# Patient Record
Sex: Female | Born: 1940 | Smoking: Former smoker
Health system: Southern US, Community
[De-identification: ages and names within clinical notes are randomized; demographics above are authoritative.]

## PROBLEM LIST (undated history)

## (undated) DIAGNOSIS — IMO0001 Reserved for inherently not codable concepts without codable children: Secondary | ICD-10-CM

## (undated) DIAGNOSIS — R011 Cardiac murmur, unspecified: Secondary | ICD-10-CM

## (undated) DIAGNOSIS — T8859XA Other complications of anesthesia, initial encounter: Secondary | ICD-10-CM

## (undated) DIAGNOSIS — K766 Portal hypertension: Secondary | ICD-10-CM

## (undated) DIAGNOSIS — I2721 Secondary pulmonary arterial hypertension: Secondary | ICD-10-CM

## (undated) DIAGNOSIS — I4891 Unspecified atrial fibrillation: Secondary | ICD-10-CM

## (undated) DIAGNOSIS — M353 Polymyalgia rheumatica: Secondary | ICD-10-CM

## (undated) DIAGNOSIS — C801 Malignant (primary) neoplasm, unspecified: Secondary | ICD-10-CM

## (undated) DIAGNOSIS — K219 Gastro-esophageal reflux disease without esophagitis: Secondary | ICD-10-CM

## (undated) DIAGNOSIS — D649 Anemia, unspecified: Secondary | ICD-10-CM

## (undated) DIAGNOSIS — T4145XA Adverse effect of unspecified anesthetic, initial encounter: Secondary | ICD-10-CM

## (undated) DIAGNOSIS — G629 Polyneuropathy, unspecified: Secondary | ICD-10-CM

## (undated) DIAGNOSIS — G709 Myoneural disorder, unspecified: Secondary | ICD-10-CM

## (undated) DIAGNOSIS — J45909 Unspecified asthma, uncomplicated: Secondary | ICD-10-CM

## (undated) DIAGNOSIS — J189 Pneumonia, unspecified organism: Secondary | ICD-10-CM

## (undated) DIAGNOSIS — I959 Hypotension, unspecified: Secondary | ICD-10-CM

## (undated) DIAGNOSIS — I499 Cardiac arrhythmia, unspecified: Secondary | ICD-10-CM

## (undated) DIAGNOSIS — F329 Major depressive disorder, single episode, unspecified: Secondary | ICD-10-CM

## (undated) DIAGNOSIS — R112 Nausea with vomiting, unspecified: Secondary | ICD-10-CM

## (undated) DIAGNOSIS — G4730 Sleep apnea, unspecified: Secondary | ICD-10-CM

## (undated) DIAGNOSIS — Z9889 Other specified postprocedural states: Secondary | ICD-10-CM

## (undated) DIAGNOSIS — E039 Hypothyroidism, unspecified: Secondary | ICD-10-CM

## (undated) DIAGNOSIS — F32A Depression, unspecified: Secondary | ICD-10-CM

## (undated) DIAGNOSIS — J449 Chronic obstructive pulmonary disease, unspecified: Secondary | ICD-10-CM

## (undated) DIAGNOSIS — M1990 Unspecified osteoarthritis, unspecified site: Secondary | ICD-10-CM

## (undated) HISTORY — DX: Portal hypertension: K76.6

## (undated) HISTORY — DX: Unspecified atrial fibrillation: I48.91

## (undated) HISTORY — PX: COLONOSCOPY: SHX174

## (undated) HISTORY — PX: TONSILLECTOMY: SUR1361

## (undated) HISTORY — DX: Secondary pulmonary arterial hypertension: I27.21

## (undated) HISTORY — PX: EYE SURGERY: SHX253

## (undated) HISTORY — PX: JOINT REPLACEMENT: SHX530

## (undated) HISTORY — PX: BACK SURGERY: SHX140

## (undated) HISTORY — PX: OTHER SURGICAL HISTORY: SHX169

## (undated) HISTORY — PX: MASTECTOMY: SHX3

## (undated) HISTORY — PX: BREAST SURGERY: SHX581

## (undated) HISTORY — DX: Polymyalgia rheumatica: M35.3

---

## 2015-08-29 ENCOUNTER — Other Ambulatory Visit: Payer: Self-pay | Admitting: Anesthesiology

## 2015-08-29 DIAGNOSIS — M5416 Radiculopathy, lumbar region: Secondary | ICD-10-CM

## 2015-09-11 ENCOUNTER — Ambulatory Visit
Admission: RE | Admit: 2015-09-11 | Discharge: 2015-09-11 | Disposition: A | Discharge status: Home / Self Care | Payer: Medicare Other | Source: Ambulatory Visit | Attending: Anesthesiology | Admitting: Anesthesiology

## 2015-09-11 ENCOUNTER — Other Ambulatory Visit: Payer: Self-pay

## 2015-09-11 DIAGNOSIS — M5416 Radiculopathy, lumbar region: Secondary | ICD-10-CM

## 2015-09-11 NOTE — Discharge Instructions (Signed)
Myelogram Discharge Instructions  1. Go home and rest quietly for the next 24 hours.  It is important to lie flat for the next 24 hours.  Get up only to go to the restroom.  You may lie in the bed or on a couch on your back, your stomach, your left side or your right side.  You may have one pillow under your head.  You may have pillows between your knees while you are on your side or under your knees while you are on your back.  2. DO NOT drive today.  Recline the seat as far back as it will go, while still wearing your seat belt, on the way home.  3. You may get up to go to the bathroom as needed.  You may sit up for 10 minutes to eat.  You may resume your normal diet and medications unless otherwise indicated.  Drink lots of extra fluids today and tomorrow.  4. The incidence of headache, nausea, or vomiting is about 5% (one in 20 patients).  If you develop a headache, lie flat and drink plenty of fluids until the headache goes away.  Caffeinated beverages may be helpful.  If you develop severe nausea and vomiting or a headache that does not go away with flat bed rest, call [phone].  5. You may resume normal activities after your 24 hours of bed rest is over; however, do not exert yourself strongly or do any heavy lifting tomorrow. If when you get up you have a headache when standing, go back to bed and force fluids for another 24 hours.  6. Call your physician for a follow-up appointment.  The results of your myelogram will be sent directly to your physician by the following day.  7. If you have any questions or if complications develop after you arrive home, please call [phone].  Discharge instructions have been explained to the patient.  The patient, or the person responsible for the patient, fully understands these instructions.       May resume Fluoxetine on Nov. 15, 2016, after 9:30 am.

## 2015-09-13 ENCOUNTER — Other Ambulatory Visit: Payer: Self-pay | Admitting: Anesthesiology

## 2015-09-13 DIAGNOSIS — M5416 Radiculopathy, lumbar region: Secondary | ICD-10-CM

## 2015-09-15 ENCOUNTER — Ambulatory Visit
Admission: RE | Admit: 2015-09-15 | Discharge: 2015-09-15 | Disposition: A | Discharge status: Home / Self Care | Payer: Medicare Other | Source: Ambulatory Visit | Attending: Anesthesiology | Admitting: Anesthesiology

## 2015-09-15 DIAGNOSIS — M5416 Radiculopathy, lumbar region: Secondary | ICD-10-CM

## 2015-09-15 MED ORDER — DIAZEPAM 5 MG PO TABS
5.0000 mg | ORAL_TABLET | Freq: Once | ORAL | Status: AC
  Administered 2015-09-15: 5 mg via ORAL

## 2015-09-15 MED ORDER — IOHEXOL 300 MG/ML  SOLN
10.0000 mL | Freq: Once | INTRAMUSCULAR | Status: DC | PRN
  Administered 2015-09-15: 10 mL via INTRATHECAL

## 2015-09-15 NOTE — Progress Notes (Signed)
Pt states she has been off Fluoxetine and Elavil for the past 2 days.  Discharge instructions explained to pt.

## 2015-09-15 NOTE — Discharge Instructions (Signed)
Myelogram Discharge Instructions  1. Go home and rest quietly for the next 24 hours.  It is important to lie flat for the next 24 hours.  Get up only to go to the restroom.  You may lie in the bed or on a couch on your back, your stomach, your left side or your right side.  You may have one pillow under your head.  You may have pillows between your knees while you are on your side or under your knees while you are on your back.  2. DO NOT drive today.  Recline the seat as far back as it will go, while still wearing your seat belt, on the way home.  3. You may get up to go to the bathroom as needed.  You may sit up for 10 minutes to eat.  You may resume your normal diet and medications unless otherwise indicated.  Drink lots of extra fluids today and tomorrow.  4. The incidence of headache, nausea, or vomiting is about 5% (one in 20 patients).  If you develop a headache, lie flat and drink plenty of fluids until the headache goes away.  Caffeinated beverages may be helpful.  If you develop severe nausea and vomiting or a headache that does not go away with flat bed rest, call [phone].  5. You may resume normal activities after your 24 hours of bed rest is over; however, do not exert yourself strongly or do any heavy lifting tomorrow. If when you get up you have a headache when standing, go back to bed and force fluids for another 24 hours.  6. Call your physician for a follow-up appointment.  The results of your myelogram will be sent directly to your physician by the following day.  7. If you have any questions or if complications develop after you arrive home, please call [phone].  Discharge instructions have been explained to the patient.  The patient, or the person responsible for the patient, fully understands these instructions.       May resume Fluoxetine and Elavil on Nov. 19, 2016, after 11:30 am.

## 2015-11-02 ENCOUNTER — Other Ambulatory Visit: Payer: Self-pay | Admitting: Anesthesiology

## 2015-11-17 ENCOUNTER — Other Ambulatory Visit (HOSPITAL_COMMUNITY): Payer: Self-pay | Admitting: General Practice

## 2015-11-17 NOTE — Pre-Procedure Instructions (Signed)
    Christy Davis  11/17/2015     No Pharmacies Listed   Your procedure is scheduled on Friday, January 27th, 2017.  Report to [facility] Admitting at 7:30 A.M.  Call this number if you have problems the morning of surgery:  [phone]   Remember:  Do not eat food or drink liquids after midnight.   Take these medicines the morning of surgery with A SIP OF WATER: Albuterol inhaler (please bring with you), Duloxetine (Cymbalta), Fludrocortisone (Florinef), Levothyroxine (Synthroid), Tiotropium (Spiriva),    Do not wear jewelry, make-up or nail polish.  Do not wear lotions, powders, or perfumes.  You may NOT wear deodorant.  Do not shave 48 hours prior to surgery.    Do not bring valuables to the hospital.   [facility] is not responsible for any belongings or valuables.  Contacts, dentures or bridgework may not be worn into surgery.  Leave your suitcase in the car.  After surgery it may be brought to your room.  For patients admitted to the hospital, discharge time will be determined by your treatment team.  Patients discharged the day of surgery will not be allowed to drive home.   Special instructions:  See attached.   Please read over the following fact sheets that you were given. Pain Booklet, Coughing and Deep Breathing, MRSA Information and Surgical Site Infection Prevention

## 2015-11-20 ENCOUNTER — Encounter (HOSPITAL_COMMUNITY)
Admission: RE | Admit: 2015-11-20 | Discharge: 2015-11-20 | Disposition: A | Discharge status: Home / Self Care | Payer: Medicare Other | Source: Ambulatory Visit | Attending: Anesthesiology | Admitting: Anesthesiology

## 2015-11-20 ENCOUNTER — Encounter (HOSPITAL_COMMUNITY): Payer: Self-pay

## 2015-11-20 DIAGNOSIS — Z87891 Personal history of nicotine dependence: Secondary | ICD-10-CM | POA: Insufficient documentation

## 2015-11-20 DIAGNOSIS — K219 Gastro-esophageal reflux disease without esophagitis: Secondary | ICD-10-CM | POA: Diagnosis not present

## 2015-11-20 DIAGNOSIS — F329 Major depressive disorder, single episode, unspecified: Secondary | ICD-10-CM | POA: Insufficient documentation

## 2015-11-20 DIAGNOSIS — Z79899 Other long term (current) drug therapy: Secondary | ICD-10-CM | POA: Diagnosis not present

## 2015-11-20 DIAGNOSIS — E039 Hypothyroidism, unspecified: Secondary | ICD-10-CM | POA: Diagnosis not present

## 2015-11-20 DIAGNOSIS — J45909 Unspecified asthma, uncomplicated: Secondary | ICD-10-CM | POA: Insufficient documentation

## 2015-11-20 DIAGNOSIS — J449 Chronic obstructive pulmonary disease, unspecified: Secondary | ICD-10-CM | POA: Diagnosis not present

## 2015-11-20 DIAGNOSIS — Z853 Personal history of malignant neoplasm of breast: Secondary | ICD-10-CM | POA: Diagnosis not present

## 2015-11-20 DIAGNOSIS — G588 Other specified mononeuropathies: Secondary | ICD-10-CM | POA: Diagnosis not present

## 2015-11-20 DIAGNOSIS — Z01818 Encounter for other preprocedural examination: Secondary | ICD-10-CM | POA: Insufficient documentation

## 2015-11-20 DIAGNOSIS — Z01812 Encounter for preprocedural laboratory examination: Secondary | ICD-10-CM | POA: Insufficient documentation

## 2015-11-20 DIAGNOSIS — Z9013 Acquired absence of bilateral breasts and nipples: Secondary | ICD-10-CM | POA: Insufficient documentation

## 2015-11-20 DIAGNOSIS — G709 Myoneural disorder, unspecified: Secondary | ICD-10-CM | POA: Insufficient documentation

## 2015-11-20 DIAGNOSIS — Z9689 Presence of other specified functional implants: Secondary | ICD-10-CM | POA: Insufficient documentation

## 2015-11-20 HISTORY — DX: Gastro-esophageal reflux disease without esophagitis: K21.9

## 2015-11-20 HISTORY — DX: Depression, unspecified: F32.A

## 2015-11-20 HISTORY — DX: Hypothyroidism, unspecified: E03.9

## 2015-11-20 HISTORY — DX: Myoneural disorder, unspecified: G70.9

## 2015-11-20 HISTORY — DX: Unspecified asthma, uncomplicated: J45.909

## 2015-11-20 HISTORY — DX: Reserved for inherently not codable concepts without codable children: IMO0001

## 2015-11-20 HISTORY — DX: Pneumonia, unspecified organism: J18.9

## 2015-11-20 HISTORY — DX: Malignant (primary) neoplasm, unspecified: C80.1

## 2015-11-20 HISTORY — DX: Major depressive disorder, single episode, unspecified: F32.9

## 2015-11-20 HISTORY — DX: Chronic obstructive pulmonary disease, unspecified: J44.9

## 2015-11-20 HISTORY — DX: Unspecified osteoarthritis, unspecified site: M19.90

## 2015-11-20 LAB — BASIC METABOLIC PANEL
ANION GAP: 10 (ref 5–15)
BUN: 13 mg/dL (ref 6–20)
CHLORIDE: 100 mmol/L — AB (ref 101–111)
CO2: 25 mmol/L (ref 22–32)
Calcium: 9.3 mg/dL (ref 8.9–10.3)
Creatinine, Ser: 0.69 mg/dL (ref 0.44–1.00)
Glucose, Bld: 106 mg/dL — ABNORMAL HIGH (ref 65–99)
POTASSIUM: 4.1 mmol/L (ref 3.5–5.1)
SODIUM: 135 mmol/L (ref 135–145)

## 2015-11-20 LAB — CBC
HCT: 37.7 % (ref 36.0–46.0)
HEMOGLOBIN: 12.8 g/dL (ref 12.0–15.0)
MCH: 29.3 pg (ref 26.0–34.0)
MCHC: 34 g/dL (ref 30.0–36.0)
MCV: 86.3 fL (ref 78.0–100.0)
PLATELETS: 291 10*3/uL (ref 150–400)
RBC: 4.37 MIL/uL (ref 3.87–5.11)
RDW: 13.7 % (ref 11.5–15.5)
WBC: 6.9 10*3/uL (ref 4.0–10.5)

## 2015-11-20 LAB — SURGICAL PCR SCREEN
MRSA, PCR: NEGATIVE
Staphylococcus aureus: NEGATIVE

## 2015-11-20 NOTE — Progress Notes (Signed)
PCP is Dr Drake Leach Cardiologist is Dr. Otho Perl Request sent for EKG tracing Echo and Stress test noted under care everywhere.

## 2015-11-21 NOTE — Progress Notes (Signed)
Anesthesia Chart Review: Patient is a 75 year old female scheduled for spinal cord stimulator interrogation of leads, possible implantable generator replacement on 11/24/15 by Dr. Maryjean Ka.   History includes former smoker, hypothyroidism, COPD, SOB, asthma, GERD, depression, neuromuscular disorder, arthritis, breast cancer s/p bilateral mastectomy, right TKA 05/08/15, right THA. Records in [place] also indicate that she is on Florinef for postural hypotension.  PCP is Dr. Drake Leach.  Cardiologist was reported as Dr. Otho Perl. There is also a office visit from Dr. Kerry Dory with [facility] on 03/20/15 following an two episodes of syncope (when arising from bed in the morning) which occurred two days after increasing her dose of intrathecal Prialt pump, possibly complicated by dehydration/orthostasis. He recommended follow-up with her pain clinic physician and would consider ambulatory monitor if she had recurrent episodes. Echo was also done 08/2015 at the [facility]. There was no mention of a stress test in her cardiology records and no results in [place]. Endocrinologist is Dr. Alanson Aly [facility]). [facility]ord [facility] GI is Dr. Eduard Roux [facility]). She is followed in a pain clinic at the [facility].  Meds include albuterol, Elavil, Tums, Cymbalta, Eszopiclone, Florinef, levothyroxine, Singulair, Spiriva, Prialt injection, Reclast yearly.  04/28/15 EKG tracing requested from [facility]), but according to Care Everywhere it showed NSR, possible LAE.  09/07/15 Limited Echo [facility], see Care Everywhere): EF 56%.  09/07/15 Echo [facility], see Care Everywhere): CONCLUSIONS: - Technically difficult exam due to body habitus. - Exam indication: pott's, syncope - The left ventricle is normal in size. Left ventricular systolic function is normal. EF = 56  5% (2D 4-ch.) Baseline left ventricular diastolic function is  normal. - The right ventricle is normal in size. Right ventricular systolic function is normal. - The left atrial cavity is mildly dilated. - There is moderate (2+) tricuspid valve regurgitation. - Nodular thickening/calcifcation of the tip of the non-coronary cusp. - The patient has not had a prior CC echocardiographic exam for comparison.  04/28/15 CXR ([place], see Care Everywhere): FINDINGS:  There is hyperinflation of the lungs compatible with COPD. Spinal  stimulator device noted in stable position. Heart and mediastinal  contours are within normal limits. No focal opacities or effusions.  No acute bony abnormality.  IMPRESSION: COPD.No active cardiopulmonary disease.   Preoperative labs noted.   Nursing staff to follow-up on EKG tracing. Based on currently available records, I would anticipate that she could proceed as planned if no acute changes.   [facility] Short Stay Center/Anesthesiology Phone [phone] 11/21/2015 12:16 PM

## 2015-11-23 MED ORDER — CEFAZOLIN SODIUM-DEXTROSE 2-3 GM-% IV SOLR
2.0000 g | INTRAVENOUS | Status: AC
  Administered 2015-11-24: 2 g via INTRAVENOUS
  Filled 2015-11-23: qty 50

## 2015-11-23 NOTE — Progress Notes (Signed)
Message left at [facility] to sent EKG tracing.

## 2015-11-23 NOTE — H&P (Signed)
Christy Davis is an 75 y.o. female.   Chief Complaint: pain from spinal cord stimulator HPI: very pleasant woman, patient of ours for the last 2 years, who we came to know because her provider that had managed her intrathecal drug delivery device moved.  She has a history of multiple lumbar surgeries, degenerative joint disease,cervical spondylosis.  She manages her pain with intrathecal drug therapy.  She also has a history of pudendal neuralgia and coccydynia.  About 2 years ago she was referred to see Dr. Della Goo L who implanted a [employer] spinal cord stimulator to try and manage her pelvic pain.  She reports mixed results, but finds generally that the type of stimulation she has gotten only induces worsening of her pelvic pain.  She is undergone multiple reprogramming sessions.  At this stage her question is whether different types of stimulation might be tried to salvage her stimulator, or whether the stimulator should just be removed.  Past Medical History  Diagnosis Date  . Hypothyroidism   . COPD (chronic obstructive pulmonary disease) ([place])   . Shortness of breath dyspnea   . Asthma   . Pneumonia   . Depression   . GERD (gastroesophageal reflux disease)   . Neuromuscular disorder ([place])   . Arthritis   . Cancer [name])     breast cancer    Past Surgical History  Procedure Laterality Date  . Mastectomy Bilateral   . Tonsillectomy    . Colonoscopy    . Joint replacement      right knee and right hip  . Pudendal nerve    . Back surgery      No family history on file. Social History:  reports that she quit smoking about 40 years ago. She does not have any smokeless tobacco history on file. She reports that she drinks alcohol. She reports that she does not use illicit drugs.  Allergies:  Allergies  Allergen Reactions  . Sucralfate Other (See Comments)    Burning Sensation  . Tapentadol Other (See Comments)    Medications Prior to Admission  Medication Sig Dispense Refill   . amitriptyline (ELAVIL) 10 MG tablet Take 10 mg by mouth at bedtime.    . diclofenac sodium (VOLTAREN) 1 % GEL Apply 4 g topically 3 (three) times daily as needed.    . DULoxetine (CYMBALTA) 20 MG capsule Take 20 mg by mouth daily.    . Eszopiclone 3 MG TABS Take 3 mg by mouth at bedtime. Take immediately before bedtime    . fludrocortisone (FLORINEF) 0.1 MG tablet Take 0.1 mg by mouth daily.    Marland Kitchen levothyroxine (SYNTHROID, LEVOTHROID) 88 MCG tablet Take 88 mcg by mouth daily before breakfast.    . montelukast (SINGULAIR) 10 MG tablet Take 10 mg by mouth at bedtime.    Marland Kitchen tiotropium (SPIRIVA) 18 MCG inhalation capsule Place 18 mcg into inhaler and inhale daily.    . Vitamin D, Cholecalciferol, 1000 units CAPS Take 1,000 Units by mouth daily.    Marland Kitchen albuterol (PROVENTIL HFA;VENTOLIN HFA) 108 (90 Base) MCG/ACT inhaler Inhale 2 puffs into the lungs every 6 (six) hours as needed for wheezing or shortness of breath.    . calcium carbonate (TUMS EX) 750 MG chewable tablet Chew 2 tablets by mouth 2 (two) times daily.    . ziconotide (PRIALT) 500 MCG/20ML injection by Intrathecal route.    . zoledronic acid (RECLAST) 5 MG/100ML SOLN injection Inject 5 mg into the vein once.  No results found for this or any previous visit (from the past 48 hour(s)). No results found.  Review of Systems  Constitutional: Negative.   Eyes: Negative.   Respiratory: Negative.   Cardiovascular: Negative.   Gastrointestinal: Negative.   Genitourinary: Negative.   Musculoskeletal: Positive for back pain and neck pain. Negative for myalgias and falls.  Skin: Negative.   Neurological: Negative.   Endo/Heme/Allergies: Negative.   Psychiatric/Behavioral: Negative.     Pulse 66, temperature 97.9 F (36.6 C), temperature source Oral, resp. rate 20, height 5\' 4"  (1.626 m), weight 48.081 kg (106 lb), SpO2 100 %. Physical Exam  Constitutional: She is oriented to person, place, and time. She appears well-developed and  well-nourished.  HENT:  Head: Normocephalic and atraumatic.  Eyes: Conjunctivae and EOM are normal. Pupils are equal, round, and reactive to light.  Neck: Normal range of motion.  Cardiovascular: Normal rate.   GI: Soft.  Musculoskeletal: Normal range of motion.  Neurological: She is alert and oriented to person, place, and time.  Skin: Skin is warm and dry.  Psychiatric: She has a normal mood and affect. Her behavior is normal. Judgment and thought content normal.     Assessment/Plan Chronic pain Pelvic pain SCS dysfunction  PLAN: [facility] evaluation of current stimulator, battery replacement versus extirpation of the system  Bonna Gains 11/24/2015, 9:11 AM

## 2015-11-24 ENCOUNTER — Ambulatory Visit (HOSPITAL_COMMUNITY): Payer: Medicare Other | Admitting: Vascular Surgery

## 2015-11-24 ENCOUNTER — Encounter (HOSPITAL_COMMUNITY)
Admission: RE | Disposition: A | Discharge status: Home / Self Care | Payer: Self-pay | Source: Ambulatory Visit | Attending: Anesthesiology

## 2015-11-24 ENCOUNTER — Ambulatory Visit (HOSPITAL_COMMUNITY)
Admission: RE | Admit: 2015-11-24 | Discharge: 2015-11-24 | Disposition: A | Discharge status: Home / Self Care | Payer: Medicare Other | Source: Ambulatory Visit | Attending: Anesthesiology | Admitting: Anesthesiology

## 2015-11-24 ENCOUNTER — Encounter (HOSPITAL_COMMUNITY): Payer: Self-pay | Admitting: *Deleted

## 2015-11-24 ENCOUNTER — Ambulatory Visit (HOSPITAL_COMMUNITY): Payer: Medicare Other | Admitting: Anesthesiology

## 2015-11-24 DIAGNOSIS — Z79899 Other long term (current) drug therapy: Secondary | ICD-10-CM | POA: Insufficient documentation

## 2015-11-24 DIAGNOSIS — E039 Hypothyroidism, unspecified: Secondary | ICD-10-CM | POA: Insufficient documentation

## 2015-11-24 DIAGNOSIS — Z7983 Long term (current) use of bisphosphonates: Secondary | ICD-10-CM | POA: Insufficient documentation

## 2015-11-24 DIAGNOSIS — R102 Pelvic and perineal pain: Secondary | ICD-10-CM | POA: Insufficient documentation

## 2015-11-24 DIAGNOSIS — M1990 Unspecified osteoarthritis, unspecified site: Secondary | ICD-10-CM | POA: Insufficient documentation

## 2015-11-24 DIAGNOSIS — J45909 Unspecified asthma, uncomplicated: Secondary | ICD-10-CM | POA: Insufficient documentation

## 2015-11-24 DIAGNOSIS — M533 Sacrococcygeal disorders, not elsewhere classified: Secondary | ICD-10-CM | POA: Insufficient documentation

## 2015-11-24 DIAGNOSIS — J449 Chronic obstructive pulmonary disease, unspecified: Secondary | ICD-10-CM | POA: Insufficient documentation

## 2015-11-24 DIAGNOSIS — F329 Major depressive disorder, single episode, unspecified: Secondary | ICD-10-CM | POA: Insufficient documentation

## 2015-11-24 DIAGNOSIS — Z87891 Personal history of nicotine dependence: Secondary | ICD-10-CM | POA: Diagnosis not present

## 2015-11-24 DIAGNOSIS — Z96651 Presence of right artificial knee joint: Secondary | ICD-10-CM | POA: Diagnosis not present

## 2015-11-24 DIAGNOSIS — Z853 Personal history of malignant neoplasm of breast: Secondary | ICD-10-CM | POA: Diagnosis not present

## 2015-11-24 DIAGNOSIS — K219 Gastro-esophageal reflux disease without esophagitis: Secondary | ICD-10-CM | POA: Diagnosis not present

## 2015-11-24 DIAGNOSIS — M961 Postlaminectomy syndrome, not elsewhere classified: Secondary | ICD-10-CM | POA: Insufficient documentation

## 2015-11-24 DIAGNOSIS — G8929 Other chronic pain: Secondary | ICD-10-CM | POA: Diagnosis not present

## 2015-11-24 DIAGNOSIS — Z96641 Presence of right artificial hip joint: Secondary | ICD-10-CM | POA: Diagnosis not present

## 2015-11-24 HISTORY — PX: SPINAL CORD STIMULATOR INSERTION: SHX5378

## 2015-11-24 SURGERY — INSERTION, SPINAL CORD STIMULATOR, LUMBAR
Anesthesia: Monitor Anesthesia Care

## 2015-11-24 MED ORDER — LIDOCAINE HCL (CARDIAC) 20 MG/ML IV SOLN
INTRAVENOUS | Status: DC | PRN
  Administered 2015-11-24: 50 mg via INTRATRACHEAL

## 2015-11-24 MED ORDER — PROPOFOL 10 MG/ML IV BOLUS
INTRAVENOUS | Status: DC | PRN
  Administered 2015-11-24 (×3): 10 mg via INTRAVENOUS
  Administered 2015-11-24: 20 mg via INTRAVENOUS

## 2015-11-24 MED ORDER — MIDAZOLAM HCL 5 MG/5ML IJ SOLN
INTRAMUSCULAR | Status: DC | PRN
  Administered 2015-11-24 (×2): 0.5 mg via INTRAVENOUS

## 2015-11-24 MED ORDER — LACTATED RINGERS IV SOLN
INTRAVENOUS | Status: DC
Start: 2015-11-24 — End: 2015-11-24
  Administered 2015-11-24 (×2): via INTRAVENOUS

## 2015-11-24 MED ORDER — BACITRACIN 50000 UNITS IM SOLR
INTRAMUSCULAR | Status: DC | PRN
  Administered 2015-11-24: 10:00:00

## 2015-11-24 MED ORDER — LIDOCAINE HCL (CARDIAC) 20 MG/ML IV SOLN
INTRAVENOUS | Status: AC
  Filled 2015-11-24: qty 5

## 2015-11-24 MED ORDER — MIDAZOLAM HCL 2 MG/2ML IJ SOLN
INTRAMUSCULAR | Status: AC
  Filled 2015-11-24: qty 2

## 2015-11-24 MED ORDER — PROMETHAZINE HCL 25 MG/ML IJ SOLN: 6.2500 mg | INTRAMUSCULAR | Status: DC | PRN

## 2015-11-24 MED ORDER — HYDROCODONE-ACETAMINOPHEN 7.5-325 MG PO TABS: 1.0000 | ORAL_TABLET | Freq: Four times a day (QID) | ORAL | Status: DC | PRN

## 2015-11-24 MED ORDER — FENTANYL CITRATE (PF) 250 MCG/5ML IJ SOLN
INTRAMUSCULAR | Status: AC
  Filled 2015-11-24: qty 5

## 2015-11-24 MED ORDER — FENTANYL CITRATE (PF) 100 MCG/2ML IJ SOLN: 25.0000 ug | INTRAMUSCULAR | Status: DC | PRN

## 2015-11-24 MED ORDER — FENTANYL CITRATE (PF) 100 MCG/2ML IJ SOLN
INTRAMUSCULAR | Status: DC | PRN
  Administered 2015-11-24 (×3): 50 ug via INTRAVENOUS

## 2015-11-24 MED ORDER — 0.9 % SODIUM CHLORIDE (POUR BTL) OPTIME
TOPICAL | Status: DC | PRN
  Administered 2015-11-24: 1000 mL

## 2015-11-24 MED ORDER — BUPIVACAINE-EPINEPHRINE (PF) 0.5% -1:200000 IJ SOLN
INTRAMUSCULAR | Status: DC | PRN
  Administered 2015-11-24: 6 mL via PERINEURAL

## 2015-11-24 SURGICAL SUPPLY — 63 items
ADAPTER S8 15 PRECISION (Neuro Prosthesis/Implant) ×2 IMPLANT
APL SKNCLS STERI-STRIP NONHPOA (GAUZE/BANDAGES/DRESSINGS)
BAG DECANTER FOR FLEXI CONT (MISCELLANEOUS) ×2 IMPLANT
BENZOIN TINCTURE PRP APPL 2/3 (GAUZE/BANDAGES/DRESSINGS) IMPLANT
BINDER ABDOMINAL 12 ML 46-62 (SOFTGOODS) ×1 IMPLANT
BLADE CLIPPER SURG (BLADE) IMPLANT
CABLE OR STIMULATOR 2X8 61 (WIRE) ×1 IMPLANT
CHLORAPREP W/TINT 26ML (MISCELLANEOUS) ×2 IMPLANT
CLIP TI WIDE RED SMALL 6 (CLIP) IMPLANT
DRAPE C-ARM 42X72 X-RAY (DRAPES) ×1 IMPLANT
DRAPE C-ARMOR (DRAPES) ×1 IMPLANT
DRAPE LAPAROTOMY 100X72X124 (DRAPES) ×2 IMPLANT
DRAPE POUCH INSTRU U-SHP 10X18 (DRAPES) ×2 IMPLANT
DRAPE SURG 17X23 STRL (DRAPES) ×2 IMPLANT
DRSG OPSITE POSTOP 3X4 (GAUZE/BANDAGES/DRESSINGS) ×1 IMPLANT
DRSG OPSITE POSTOP 4X6 (GAUZE/BANDAGES/DRESSINGS) IMPLANT
ELECT REM PT RETURN 9FT ADLT (ELECTROSURGICAL) ×2
ELECTRODE REM PT RTRN 9FT ADLT (ELECTROSURGICAL) ×1 IMPLANT
GAUZE SPONGE 4X4 16PLY XRAY LF (GAUZE/BANDAGES/DRESSINGS) ×2 IMPLANT
GLOVE BIOGEL PI IND STRL 7.5 (GLOVE) ×1 IMPLANT
GLOVE BIOGEL PI INDICATOR 7.5 (GLOVE) ×1
GLOVE ECLIPSE 7.5 STRL STRAW (GLOVE) ×2 IMPLANT
GLOVE EXAM NITRILE LRG STRL (GLOVE) IMPLANT
GLOVE EXAM NITRILE MD LF STRL (GLOVE) IMPLANT
GLOVE EXAM NITRILE XL STR (GLOVE) IMPLANT
GLOVE EXAM NITRILE XS STR PU (GLOVE) IMPLANT
GOWN STRL REUS W/ TWL LRG LVL3 (GOWN DISPOSABLE) IMPLANT
GOWN STRL REUS W/ TWL XL LVL3 (GOWN DISPOSABLE) IMPLANT
GOWN STRL REUS W/TWL 2XL LVL3 (GOWN DISPOSABLE) IMPLANT
GOWN STRL REUS W/TWL LRG LVL3 (GOWN DISPOSABLE)
GOWN STRL REUS W/TWL XL LVL3 (GOWN DISPOSABLE)
IPG PRECISION SPECTRA (Stimulator) ×1 IMPLANT
KIT BASIN OR (CUSTOM PROCEDURE TRAY) ×2 IMPLANT
KIT CHARGING (KITS) ×1
KIT CHARGING PRECISION NEURO (KITS) IMPLANT
KIT PAT PROGRAM FREELINK (KITS) IMPLANT
KIT ROOM TURNOVER OR (KITS) ×2 IMPLANT
LIQUID BAND (GAUZE/BANDAGES/DRESSINGS) ×2 IMPLANT
NDL 18GX1X1/2 (RX/OR ONLY) (NEEDLE) IMPLANT
NDL HYPO 25X1 1.5 SAFETY (NEEDLE) ×1 IMPLANT
NEEDLE 18GX1X1/2 (RX/OR ONLY) (NEEDLE) IMPLANT
NEEDLE HYPO 25X1 1.5 SAFETY (NEEDLE) ×2 IMPLANT
NS IRRIG 1000ML POUR BTL (IV SOLUTION) ×2 IMPLANT
PACK LAMINECTOMY NEURO (CUSTOM PROCEDURE TRAY) ×2 IMPLANT
PAD ARMBOARD 7.5X6 YLW CONV (MISCELLANEOUS) ×4 IMPLANT
REMOTE CONTROL KIT (KITS) ×2
SPONGE LAP 4X18 X RAY DECT (DISPOSABLE) ×1 IMPLANT
SPONGE SURGIFOAM ABS GEL SZ50 (HEMOSTASIS) IMPLANT
STAPLER SKIN PROX WIDE 3.9 (STAPLE) ×2 IMPLANT
STRIP CLOSURE SKIN 1/2X4 (GAUZE/BANDAGES/DRESSINGS) IMPLANT
SUT MNCRL AB 4-0 PS2 18 (SUTURE) ×1 IMPLANT
SUT SILK 0 (SUTURE)
SUT SILK 0 MO-6 18XCR BRD 8 (SUTURE) ×1 IMPLANT
SUT SILK 0 TIES 10X30 (SUTURE) IMPLANT
SUT SILK 2 0 TIES 10X30 (SUTURE) IMPLANT
SUT VIC AB 2-0 CP2 18 (SUTURE) ×4 IMPLANT
SYR EPIDURAL 5ML GLASS (SYRINGE) ×2 IMPLANT
SYRINGE 10CC LL (SYRINGE) IMPLANT
TOWEL OR 17X24 6PK STRL BLUE (TOWEL DISPOSABLE) ×1 IMPLANT
TOWEL OR 17X26 10 PK STRL BLUE (TOWEL DISPOSABLE) ×2 IMPLANT
WATER STERILE IRR 1000ML POUR (IV SOLUTION) ×2 IMPLANT
WRENCH HEX 4.3 (SPINAL CORD STIMULATOR) ×1 IMPLANT
YANKAUER SUCT BULB TIP NO VENT (SUCTIONS) ×2 IMPLANT

## 2015-11-24 NOTE — Anesthesia Postprocedure Evaluation (Signed)
Anesthesia Post Note  Patient: Christy Davis  Procedure(s) Performed: Procedure(s) (LRB): Spinal cord stimulator interregation of leads,  implantable pulse generator replacement (N/A)  Patient location during evaluation: PACU Anesthesia Type: MAC Level of consciousness: awake and alert Pain management: pain level controlled Vital Signs Assessment: post-procedure vital signs reviewed and stable Respiratory status: spontaneous breathing Cardiovascular status: blood pressure returned to baseline Anesthetic complications: no    Last Vitals:  Filed Vitals:   11/24/15 1044 11/24/15 1100  BP: 143/82 144/83  Pulse: 74 77  Temp:    Resp: 16 16    Last Pain:  Filed Vitals:   11/24/15 1103  PainSc: 3                  Tiajuana Amass

## 2015-11-24 NOTE — Op Note (Signed)
PREOP DX: 1) lumbar post-laminectomy syndrome  2) chronic coccydynia, pelvic pain 3) chronic pain  POSTOP DX: same as preop PROCEDURES PERFORMED:1) interrogation of leads 2) IPG replacement SURGEON:Danice Dippolito  ASSISTANT: NONE  ANESTHESIA: MAC/TIVA EBL: <10cc  DESCRIPTION OF PROCEDURE: After a discussion of risks, benefits and alternatives, informed consent was obtained. The patient was taken to the OR, general anesthesia induced by the anesthesia team without difficulty, turned prone onto a Jackson table, all pressure points padded, SCD's placed. A timeout was taken to verify the correct patient, position, personnel, availability of appropriate equipment, and administration of perioperative antibiotics.  The thoracic and lumbar areas were widely prepped with chloraprep and draped into a sterile field. The skin and subcutaneous tissues around the patient's previous pocket incision was infiltrated with 0.25% bupivicaine 1:200K epinephrine. The subcutaneous pocket was incised with a 10 blade and using sharp, careful dissection the pocket opened and the IPG delivered onto the field. The pocket was inspected for hemostasis, which was found to be excellent. The leads were removed from the IPG, and [employer] connectors placed in line. Leads were then interrogated with a Spectra system, and the patient reported good coverage in her abdomen, pelvis, and legs. Pulse parameters and pulse width were changed with the patient reporting some improvement in comfort. With good coverage and better comfort levels obtained, Ms. Loving wished to keep the sytem. We thus placed leads in new Spectra battery, which fit into the old pocket well.  The  Incision was copiously irrigated with bacitracin-containing irrigation.The pocket incision was closed with a deeper layer of 2-0 vicryl interrupted sutures, and the skin closed with a running 4-0 subcuticular monocryl suture and dermabond. Sterile dressings were  applied. Needle, sponge, and instrument counts were correct x2 at the end of the case.  The patient was then carefully awakened from anesthesia, turned supine, an abdominal binder placed, and the patient taken to the recovery room. COMPLICATIONS: NONE  CONDITION: Stable throughout the course of the procedure and immediately afterward  DISPOSITION: discharge to home. Discussed care with the patient. Her sister in law was not available at the time of this note dictation, but we will contact her. Followup in clinic will be scheduled in 10-14 days.

## 2015-11-24 NOTE — Discharge Instructions (Signed)
Dr. Stephaie Dardis Post-Op Orders ° °• Ice Pack - 20 minutes on (in a pillow case), and 20 minutes off. Wear the ice pack UNDER the binder. °• Follow up in office, they will call you for an appointment in 10 days to 2 weeks. °• Increase activity gradually.   °• No lifting anything heavier than a gallon of milk (10 pounds) until seen in the office. °• Advance diet slowly as tolerated. °• Dressing care:  Keep dressing dry for 3 days, and on Post-op day 4, may shower. °• Call for fever, drainage, and redness. °• No swimming or bathing in a bathtub (do not get into standing water). °•  °

## 2015-11-24 NOTE — Anesthesia Preprocedure Evaluation (Addendum)
Anesthesia Evaluation  Patient identified by MRN, date of birth, ID band Patient awake    Reviewed: Allergy & Precautions, NPO status , Patient's Chart, lab work & pertinent test results  Airway Mallampati: II  TM Distance: >3 FB Neck ROM: Full    Dental  (+) Teeth Intact   Pulmonary asthma , COPD,  COPD inhaler, former smoker,    breath sounds clear to auscultation       Cardiovascular negative cardio ROS   Rhythm:Regular Rate:Normal     Neuro/Psych Depression  Neuromuscular disease    GI/Hepatic Neg liver ROS, GERD  ,  Endo/Other  Hypothyroidism   Renal/GU negative Renal ROS     Musculoskeletal  (+) Arthritis ,   Abdominal   Peds  Hematology negative hematology ROS (+)   Anesthesia Other Findings   Reproductive/Obstetrics                            Lab Results  Component Value Date   WBC 6.9 11/20/2015   HGB 12.8 11/20/2015   HCT 37.7 11/20/2015   MCV 86.3 11/20/2015   PLT 291 11/20/2015   Lab Results  Component Value Date   CREATININE 0.69 11/20/2015   BUN 13 11/20/2015   NA 135 11/20/2015   K 4.1 11/20/2015   CL 100* 11/20/2015   CO2 25 11/20/2015    Anesthesia Physical Anesthesia Plan  ASA: III  Anesthesia Plan: MAC   Post-op Pain Management:    Induction: Intravenous  Airway Management Planned: Natural Airway and Nasal Cannula  Additional Equipment:   Intra-op Plan:   Post-operative Plan:   Informed Consent: I have reviewed the patients History and Physical, chart, labs and discussed the procedure including the risks, benefits and alternatives for the proposed anesthesia with the patient or authorized representative who has indicated his/her understanding and acceptance.     Plan Discussed with: CRNA  Anesthesia Plan Comments:         Anesthesia Quick Evaluation

## 2015-11-24 NOTE — Transfer of Care (Signed)
Immediate Anesthesia Transfer of Care Note  Patient: Christy Davis  Procedure(s) Performed: Procedure(s) with comments: Spinal cord stimulator interregation of leads,  implantable pulse generator replacement (N/A) - Spinal cord stimulator interregation of leads,  implantable pulse generator replacement  Patient Location: PACU  Anesthesia Type:MAC  Level of Consciousness: awake, alert , oriented and patient cooperative  Airway & Oxygen Therapy: Patient Spontanous Breathing and Patient connected to nasal cannula oxygen  Post-op Assessment: Report given to RN, Post -op Vital signs reviewed and stable and Patient moving all extremities  Post vital signs: Reviewed and stable  Last Vitals:  Filed Vitals:   11/24/15 0818  Pulse: 66  Temp: 36.6 C  Resp: 20    Complications: No apparent anesthesia complications

## 2015-11-27 ENCOUNTER — Encounter (HOSPITAL_COMMUNITY): Payer: Self-pay | Admitting: Anesthesiology

## 2015-12-05 ENCOUNTER — Encounter (HOSPITAL_COMMUNITY): Payer: Self-pay | Admitting: Anesthesiology

## 2016-04-27 HISTORY — PX: TOTAL SHOULDER REPLACEMENT: SUR1217

## 2016-10-11 ENCOUNTER — Other Ambulatory Visit: Payer: Self-pay | Admitting: Anesthesiology

## 2016-11-11 ENCOUNTER — Encounter (HOSPITAL_COMMUNITY)
Admission: RE | Admit: 2016-11-11 | Discharge: 2016-11-11 | Disposition: A | Discharge status: Home / Self Care | Payer: Medicare Other | Source: Ambulatory Visit | Attending: Anesthesiology | Admitting: Anesthesiology

## 2016-11-11 ENCOUNTER — Encounter (HOSPITAL_COMMUNITY): Payer: Self-pay

## 2016-11-11 DIAGNOSIS — Z01818 Encounter for other preprocedural examination: Secondary | ICD-10-CM | POA: Insufficient documentation

## 2016-11-11 DIAGNOSIS — K219 Gastro-esophageal reflux disease without esophagitis: Secondary | ICD-10-CM | POA: Insufficient documentation

## 2016-11-11 DIAGNOSIS — I959 Hypotension, unspecified: Secondary | ICD-10-CM | POA: Diagnosis not present

## 2016-11-11 DIAGNOSIS — Z87891 Personal history of nicotine dependence: Secondary | ICD-10-CM | POA: Insufficient documentation

## 2016-11-11 DIAGNOSIS — I480 Paroxysmal atrial fibrillation: Secondary | ICD-10-CM | POA: Diagnosis not present

## 2016-11-11 DIAGNOSIS — E039 Hypothyroidism, unspecified: Secondary | ICD-10-CM | POA: Diagnosis not present

## 2016-11-11 DIAGNOSIS — J449 Chronic obstructive pulmonary disease, unspecified: Secondary | ICD-10-CM | POA: Insufficient documentation

## 2016-11-11 HISTORY — DX: Cardiac murmur, unspecified: R01.1

## 2016-11-11 HISTORY — DX: Other complications of anesthesia, initial encounter: T88.59XA

## 2016-11-11 HISTORY — DX: Hypotension, unspecified: I95.9

## 2016-11-11 HISTORY — DX: Polyneuropathy, unspecified: G62.9

## 2016-11-11 HISTORY — DX: Cardiac arrhythmia, unspecified: I49.9

## 2016-11-11 HISTORY — DX: Adverse effect of unspecified anesthetic, initial encounter: T41.45XA

## 2016-11-11 HISTORY — DX: Other specified postprocedural states: Z98.890

## 2016-11-11 HISTORY — DX: Nausea with vomiting, unspecified: R11.2

## 2016-11-11 LAB — BASIC METABOLIC PANEL
ANION GAP: 11 (ref 5–15)
BUN: 13 mg/dL (ref 6–20)
CALCIUM: 9.4 mg/dL (ref 8.9–10.3)
CO2: 27 mmol/L (ref 22–32)
Chloride: 98 mmol/L — ABNORMAL LOW (ref 101–111)
Creatinine, Ser: 0.84 mg/dL (ref 0.44–1.00)
Glucose, Bld: 77 mg/dL (ref 65–99)
Potassium: 4.2 mmol/L (ref 3.5–5.1)
SODIUM: 136 mmol/L (ref 135–145)

## 2016-11-11 LAB — CBC
HCT: 37.5 % (ref 36.0–46.0)
HEMOGLOBIN: 12.3 g/dL (ref 12.0–15.0)
MCH: 28.9 pg (ref 26.0–34.0)
MCHC: 32.8 g/dL (ref 30.0–36.0)
MCV: 88.2 fL (ref 78.0–100.0)
Platelets: 308 10*3/uL (ref 150–400)
RBC: 4.25 MIL/uL (ref 3.87–5.11)
RDW: 14.4 % (ref 11.5–15.5)
WBC: 7.7 10*3/uL (ref 4.0–10.5)

## 2016-11-11 NOTE — Pre-Procedure Instructions (Addendum)
    Rayen Sausedo Saltzman  11/11/2016    Your procedure is scheduled on Friday, January 19.  Report to [facility] Admitting at 7:30 AM                     Your surgery or procedure is scheduled for 9:30 AM   Call this number if you have problems the morning of surgery: [phone]              For any other questions, please call [phone], Monday - Friday 8 AM - 4 PM.     Remember:  Do not eat food or drink liquids after midnight.  Take these medicines the morning of surgery with A SIP OF WATER :dronedarone (MULTAQ), fludrocortisone (FLORINEF), levothyroxine (SYNTHROID, LEVOTHROID).  Use Spriva Inhaler.  May use Albuterol inhaler if needed and bring it to the hospital with you.               Take if needed : Tylenol.                   May use Flonase.                     Stop taking Eliquis as instructed by Dr Maryjean Ka.                    Stop diclofenac sodium (VOLTAREN) gel, Vitamins, Herbal products.   Do not wear jewelry, make-up or nail polish.  Do not wear lotions, powders, or perfumes, or deodorant.  Do not shave 48 hours prior to surgery.  .  Do not bring valuables to the hospital.  [facility] is not responsible for any belongings or valuables.  Contacts, dentures or bridgework may not be worn into surgery.  Leave your suitcase in the car.  After surgery it may be brought to your room.  For patients admitted to the hospital, discharge time will be determined by your treatment team.  Patients discharged the day of surgery will not be allowed to drive home.   Name and phone number of your driver: -  Special instructions:  Review  [place] - Preparing For Surgery.  Please read over the following fact sheets that you were given.  [place]- Preparing For Surgery and Patient Instructions for Mupirocin Application, Incentive Spirometry, Pain Booklet

## 2016-11-11 NOTE — Progress Notes (Addendum)
Ms Kitner denies chest pain - no shortness of breath today. Patient has a history of COPD. Patient was diagnosed with PAF Fall 2017, patient is on Eliquis, and Multaq, last dose of Eliquis was Sat, January 13. Patient reports that she wore a heart monitor for a while, but has not received results from the reading.  I requested records from [facility], Dr Ola Spurr , [facility].

## 2016-11-12 ENCOUNTER — Inpatient Hospital Stay (HOSPITAL_COMMUNITY): Admission: RE | Admit: 2016-11-12 | Payer: Medicare Other | Source: Ambulatory Visit

## 2016-11-12 NOTE — Progress Notes (Addendum)
Anesthesia Chart Review: Patient is a 76 year old female scheduled for intrathecal pump replacement on 11/15/16 by Dr. Maryjean Ka.   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   - [facility] Drake Leach [facility]; [facility]). - Primary cardiologist is Dr. Abran Richard [facility]), last visit 07/08/16. He referred her to EP. He gave permission to hold Eliquis if needed for future spinal injections.  - EP cardiologist is Dr. Youlanda Roys. Fitzgerald with [facility]. Last visit 10/01/16. Afib was felt stable on Multaq. Holter monitor ordered to evaluate for afib burden. Rhythm was predominant SR with non-sustained episodes of atrial tachycardia without afib. - Endocrinologist is Dr. Alanson Aly [facility]). - Pulmon[facility] is Dr[facility] Welford Roche [facility]), last visit 09/11/16. She is participating in pulmonary rehab. - GI is Dr. Eduard Roux [facility]). - Neurologist is Dr. Chales Salmon ([place]). - She has also been evaluated at the [facility].   Meds include albuterol, Augmentin, Elilquis (on hold starting 11/09/16), Multaq, Eszopiclone, Florinef, Flonase, levothyroxine, Singulair, Spiriva, Prialt injection.  BP 106/64   Pulse 73   Temp 36.8 C (Oral)   Resp 18   Ht 5' 3.5" (1.613 m)   Wt 117 lb 11.6 oz (53.4 kg)   SpO2 95%   BMI 20.53 kg/m   10/01/16 EKG [facility]): SR, RSR in V1 (nondiagnostic), LAE, non-specific T wave abnormality.   10/29/16-10/31/16 48 hour Holter Monitor [facility]; Care Everywhere): Dominant Rhythm was sinus rhythm with heart  rate ranging from 54bpm to 124bpm with average rate of 77bpm. 21Premature Ventricular Contractions,315Premature Atrial Contractions, 25Atrial couplets, 19 episodes of Nonsustained Atrial Tachycardia lasting for4 -12beats in duration with rates ranging from 83 bpm to 165 bpm. No atrial fibrillation seen.  04/17/16 Echo ([place]): Conclusion: 1. Technically limited and difficult study thin body habitus. 2. Normal LV systolic function. 3. LVEF estimated at 60-65%. 4. Trace MR/TR.  03/28/16 office note from Dr. Otho Perl indicates patient had an unremarkable stress echo in 2014.   08/17/16 CXR ([place], see Care Everywhere): FINDINGS:  Cardiac shadow is within normal limits. Spinal stimulator, spinal fusion and left shoulder surgery are again noted. The lungs are hyperinflated bilaterally infiltrate or sizable effusion is seen. Postsurgical changes are noted in the left axilla. IMPRESSION: COPD without acute abnormality.   Preoperative labs noted. She is for PT/PTT on the day of surgery.  Patient with recent cardiology follow-up and has been maintaining SR. She denied SOB and CP at PAT. Further evaluation on the day of surgery, but if no acute cardiopulmonary issues then I would anticipate that she can proceed as planned.  George Hugh [facility] Short Stay Center/Anesthesiology Phone [phone] 11/13/2016 9:39 AM

## 2016-11-15 ENCOUNTER — Ambulatory Visit (HOSPITAL_COMMUNITY): Payer: Medicare Other | Admitting: Certified Registered Nurse Anesthetist

## 2016-11-15 ENCOUNTER — Ambulatory Visit (HOSPITAL_COMMUNITY)
Admission: RE | Admit: 2016-11-15 | Discharge: 2016-11-15 | Disposition: A | Discharge status: Home / Self Care | Payer: Medicare Other | Source: Ambulatory Visit | Attending: Anesthesiology | Admitting: Anesthesiology

## 2016-11-15 ENCOUNTER — Ambulatory Visit (HOSPITAL_COMMUNITY): Payer: Medicare Other | Admitting: Emergency Medicine

## 2016-11-15 ENCOUNTER — Encounter (HOSPITAL_COMMUNITY): Payer: Self-pay | Admitting: *Deleted

## 2016-11-15 ENCOUNTER — Encounter (HOSPITAL_COMMUNITY)
Admission: RE | Disposition: A | Discharge status: Home / Self Care | Payer: Self-pay | Source: Ambulatory Visit | Attending: Anesthesiology

## 2016-11-15 DIAGNOSIS — Z7901 Long term (current) use of anticoagulants: Secondary | ICD-10-CM | POA: Insufficient documentation

## 2016-11-15 DIAGNOSIS — G588 Other specified mononeuropathies: Secondary | ICD-10-CM | POA: Diagnosis not present

## 2016-11-15 DIAGNOSIS — G894 Chronic pain syndrome: Secondary | ICD-10-CM | POA: Diagnosis present

## 2016-11-15 DIAGNOSIS — Z462 Encounter for fitting and adjustment of other devices related to nervous system and special senses: Secondary | ICD-10-CM | POA: Insufficient documentation

## 2016-11-15 DIAGNOSIS — E039 Hypothyroidism, unspecified: Secondary | ICD-10-CM | POA: Insufficient documentation

## 2016-11-15 DIAGNOSIS — J449 Chronic obstructive pulmonary disease, unspecified: Secondary | ICD-10-CM | POA: Insufficient documentation

## 2016-11-15 DIAGNOSIS — Z888 Allergy status to other drugs, medicaments and biological substances status: Secondary | ICD-10-CM | POA: Insufficient documentation

## 2016-11-15 DIAGNOSIS — I4891 Unspecified atrial fibrillation: Secondary | ICD-10-CM | POA: Insufficient documentation

## 2016-11-15 DIAGNOSIS — Z85828 Personal history of other malignant neoplasm of skin: Secondary | ICD-10-CM | POA: Insufficient documentation

## 2016-11-15 DIAGNOSIS — R011 Cardiac murmur, unspecified: Secondary | ICD-10-CM | POA: Diagnosis not present

## 2016-11-15 DIAGNOSIS — M1990 Unspecified osteoarthritis, unspecified site: Secondary | ICD-10-CM | POA: Diagnosis not present

## 2016-11-15 DIAGNOSIS — Z87891 Personal history of nicotine dependence: Secondary | ICD-10-CM | POA: Insufficient documentation

## 2016-11-15 DIAGNOSIS — K219 Gastro-esophageal reflux disease without esophagitis: Secondary | ICD-10-CM | POA: Insufficient documentation

## 2016-11-15 DIAGNOSIS — F329 Major depressive disorder, single episode, unspecified: Secondary | ICD-10-CM | POA: Insufficient documentation

## 2016-11-15 DIAGNOSIS — Z853 Personal history of malignant neoplasm of breast: Secondary | ICD-10-CM | POA: Insufficient documentation

## 2016-11-15 HISTORY — PX: PAIN PUMP REVISION: SHX6231

## 2016-11-15 LAB — PROTIME-INR
INR: 0.99
Prothrombin Time: 13.1 seconds (ref 11.4–15.2)

## 2016-11-15 LAB — APTT: aPTT: 25 seconds (ref 24–36)

## 2016-11-15 SURGERY — PAIN PUMP REVISION
Anesthesia: General | Site: Abdomen

## 2016-11-15 MED ORDER — CEFAZOLIN SODIUM-DEXTROSE 2-4 GM/100ML-% IV SOLN
INTRAVENOUS | Status: AC
  Filled 2016-11-15: qty 100

## 2016-11-15 MED ORDER — CEFAZOLIN SODIUM-DEXTROSE 2-4 GM/100ML-% IV SOLN
2.0000 g | INTRAVENOUS | Status: AC
  Administered 2016-11-15: 2 g via INTRAVENOUS

## 2016-11-15 MED ORDER — CHLORHEXIDINE GLUCONATE CLOTH 2 % EX PADS: 6.0000 | MEDICATED_PAD | Freq: Once | CUTANEOUS | Status: DC

## 2016-11-15 MED ORDER — BUPIVACAINE-EPINEPHRINE (PF) 0.5% -1:200000 IJ SOLN
INTRAMUSCULAR | Status: DC | PRN
  Administered 2016-11-15: 8 mL via PERINEURAL

## 2016-11-15 MED ORDER — 0.9 % SODIUM CHLORIDE (POUR BTL) OPTIME
TOPICAL | Status: DC | PRN
  Administered 2016-11-15: 1000 mL

## 2016-11-15 MED ORDER — LIDOCAINE 2% (20 MG/ML) 5 ML SYRINGE
INTRAMUSCULAR | Status: DC | PRN
Start: 2016-11-15 — End: 2016-11-15
  Administered 2016-11-15: 50 mg via INTRAVENOUS

## 2016-11-15 MED ORDER — DEXAMETHASONE SODIUM PHOSPHATE 10 MG/ML IJ SOLN
INTRAMUSCULAR | Status: DC | PRN
  Administered 2016-11-15: 10 mg via INTRAVENOUS

## 2016-11-15 MED ORDER — BACITRACIN 50000 UNITS IM SOLR
INTRAMUSCULAR | Status: DC | PRN
  Administered 2016-11-15: 10:00:00

## 2016-11-15 MED ORDER — MEPERIDINE HCL 25 MG/ML IJ SOLN: 6.2500 mg | INTRAMUSCULAR | Status: DC | PRN

## 2016-11-15 MED ORDER — MIDAZOLAM HCL 2 MG/2ML IJ SOLN: 0.5000 mg | Freq: Once | INTRAMUSCULAR | Status: DC | PRN

## 2016-11-15 MED ORDER — PROMETHAZINE HCL 25 MG/ML IJ SOLN: 6.2500 mg | INTRAMUSCULAR | Status: DC | PRN

## 2016-11-15 MED ORDER — LACTATED RINGERS IV SOLN
INTRAVENOUS | Status: DC
  Administered 2016-11-15 (×2): via INTRAVENOUS

## 2016-11-15 MED ORDER — PROPOFOL 10 MG/ML IV BOLUS
INTRAVENOUS | Status: DC | PRN
  Administered 2016-11-15: 100 mg via INTRAVENOUS

## 2016-11-15 MED ORDER — ONDANSETRON HCL 4 MG/2ML IJ SOLN
INTRAMUSCULAR | Status: DC | PRN
  Administered 2016-11-15: 4 mg via INTRAVENOUS

## 2016-11-15 MED ORDER — FENTANYL CITRATE (PF) 100 MCG/2ML IJ SOLN
INTRAMUSCULAR | Status: AC
  Filled 2016-11-15: qty 2

## 2016-11-15 MED ORDER — BACITRACIN ZINC 500 UNIT/GM EX OINT
TOPICAL_OINTMENT | CUTANEOUS | Status: AC
  Filled 2016-11-15: qty 28.35

## 2016-11-15 MED ORDER — FENTANYL CITRATE (PF) 100 MCG/2ML IJ SOLN
25.0000 ug | INTRAMUSCULAR | Status: DC | PRN
  Administered 2016-11-15: 50 ug via INTRAVENOUS

## 2016-11-15 MED ORDER — OXYCODONE-ACETAMINOPHEN 10-325 MG PO TABS: 1.0000 | ORAL_TABLET | ORAL | 0 refills | Status: DC | PRN

## 2016-11-15 MED ORDER — BUPIVACAINE-EPINEPHRINE (PF) 0.5% -1:200000 IJ SOLN
INTRAMUSCULAR | Status: AC
  Filled 2016-11-15: qty 30

## 2016-11-15 MED ORDER — BACITRACIN ZINC 500 UNIT/GM EX OINT
TOPICAL_OINTMENT | CUTANEOUS | Status: DC | PRN
  Administered 2016-11-15: 1 via TOPICAL

## 2016-11-15 MED ORDER — PHENYLEPHRINE HCL 10 MG/ML IJ SOLN
INTRAMUSCULAR | Status: DC | PRN
  Administered 2016-11-15 (×4): 40 ug via INTRAVENOUS

## 2016-11-15 MED ORDER — FENTANYL CITRATE (PF) 100 MCG/2ML IJ SOLN
INTRAMUSCULAR | Status: DC | PRN
  Administered 2016-11-15 (×2): 25 ug via INTRAVENOUS
  Administered 2016-11-15: 50 ug via INTRAVENOUS

## 2016-11-15 SURGICAL SUPPLY — 55 items
ADH SKN CLS APL DERMABOND .7 (GAUZE/BANDAGES/DRESSINGS)
BAG DECANTER FOR FLEXI CONT (MISCELLANEOUS) ×2 IMPLANT
BINDER ABDOMINAL 12 ML 46-62 (SOFTGOODS) ×1 IMPLANT
BLADE CLIPPER SURG (BLADE) IMPLANT
BOOT SUTURE AID YELLOW STND (SUTURE) ×1 IMPLANT
CARTRIDGE OIL MAESTRO DRILL (MISCELLANEOUS) ×1 IMPLANT
CHLORAPREP W/TINT 26ML (MISCELLANEOUS) ×2 IMPLANT
DERMABOND ADVANCED (GAUZE/BANDAGES/DRESSINGS)
DERMABOND ADVANCED .7 DNX12 (GAUZE/BANDAGES/DRESSINGS) IMPLANT
DIFFUSER DRILL AIR PNEUMATIC (MISCELLANEOUS) ×1 IMPLANT
DRAPE CAMERA VIDEO/LASER (DRAPES) ×1 IMPLANT
DRAPE LAPAROTOMY 100X72X124 (DRAPES) ×2 IMPLANT
DRAPE POUCH INSTRU U-SHP 10X18 (DRAPES) ×2 IMPLANT
DRAPE SURG 17X23 STRL (DRAPES) ×8 IMPLANT
DRSG COVADERM 4X8 (GAUZE/BANDAGES/DRESSINGS) ×1 IMPLANT
DRSG OPSITE POSTOP 4X6 (GAUZE/BANDAGES/DRESSINGS) ×1 IMPLANT
ELECT REM PT RETURN 9FT ADLT (ELECTROSURGICAL) ×2
ELECTRODE REM PT RTRN 9FT ADLT (ELECTROSURGICAL) ×1 IMPLANT
GAUZE SPONGE 4X4 16PLY XRAY LF (GAUZE/BANDAGES/DRESSINGS) IMPLANT
GLOVE ECLIPSE 7.5 STRL STRAW (GLOVE) ×2 IMPLANT
GLOVE EXAM NITRILE LRG STRL (GLOVE) IMPLANT
GLOVE EXAM NITRILE XL STR (GLOVE) IMPLANT
GLOVE EXAM NITRILE XS STR PU (GLOVE) IMPLANT
GOWN STRL REUS W/ TWL LRG LVL3 (GOWN DISPOSABLE) IMPLANT
GOWN STRL REUS W/ TWL XL LVL3 (GOWN DISPOSABLE) IMPLANT
GOWN STRL REUS W/TWL 2XL LVL3 (GOWN DISPOSABLE) IMPLANT
GOWN STRL REUS W/TWL LRG LVL3 (GOWN DISPOSABLE) ×4
GOWN STRL REUS W/TWL XL LVL3 (GOWN DISPOSABLE)
KIT BASIN OR (CUSTOM PROCEDURE TRAY) ×2 IMPLANT
KIT ROOM TURNOVER OR (KITS) ×2 IMPLANT
NDL 18GX1X1/2 (RX/OR ONLY) (NEEDLE) IMPLANT
NDL HYPO 25X1 1.5 SAFETY (NEEDLE) ×1 IMPLANT
NEEDLE 18GX1X1/2 (RX/OR ONLY) (NEEDLE) IMPLANT
NEEDLE HYPO 25X1 1.5 SAFETY (NEEDLE) ×2 IMPLANT
NS IRRIG 1000ML POUR BTL (IV SOLUTION) ×2 IMPLANT
OIL CARTRIDGE MAESTRO DRILL (MISCELLANEOUS)
PACK LAMINECTOMY NEURO (CUSTOM PROCEDURE TRAY) ×2 IMPLANT
PAD ARMBOARD 7.5X6 YLW CONV (MISCELLANEOUS) ×2 IMPLANT
POUCH TYRX ANTIBAC NEURO LRG (Mesh General) IMPLANT
POUCH TYRX NEURO LRG (Mesh General) ×2 IMPLANT
PUMP SYNCHROMED II 20ML RESVR (Vascular Products) ×1 IMPLANT
SPONGE LAP 4X18 X RAY DECT (DISPOSABLE) IMPLANT
SPONGE SURGIFOAM ABS GEL SZ50 (HEMOSTASIS) IMPLANT
STAPLER SKIN PROX WIDE 3.9 (STAPLE) ×2 IMPLANT
SUT MNCRL AB 4-0 PS2 18 (SUTURE) ×1 IMPLANT
SUT SILK 0 (SUTURE) ×4
SUT SILK 0 MO-6 18XCR BRD 8 (SUTURE) IMPLANT
SUT SILK 2 0 FS (SUTURE) ×2 IMPLANT
SUT VIC AB 2-0 CP2 18 (SUTURE) ×4 IMPLANT
SYR 20CC LL (SYRINGE) ×2 IMPLANT
SYR 3ML LL SCALE MARK (SYRINGE) ×1 IMPLANT
TOWEL OR 17X24 6PK STRL BLUE (TOWEL DISPOSABLE) ×2 IMPLANT
TOWEL OR 17X26 10 PK STRL BLUE (TOWEL DISPOSABLE) ×2 IMPLANT
WATER STERILE IRR 1000ML POUR (IV SOLUTION) ×2 IMPLANT
YANKAUER SUCT BULB TIP NO VENT (SUCTIONS) ×2 IMPLANT

## 2016-11-15 NOTE — Discharge Instructions (Signed)
Dr. Maryjean Ka Post-Op Orders   Ice Pack - 20 minutes on (in a pillow case), and 20 minutes off. Wear the ice pack UNDER the binder.  Follow up in office, they will call you for an appointment in 10 days to 2 weeks.  Increase activity gradually.    No lifting anything heavier than a gallon of milk (10 pounds) until seen in the office.  Advance diet slowly as tolerated.  Dressing care:  Keep dressing dry for 3 days, and on Post-op day 4, may shower.  Call for fever, drainage, and redness.  No swimming or bathing in a bathtub (do not get into standing water). RESTART ELIQUIS on 11/18/2016

## 2016-11-15 NOTE — Anesthesia Postprocedure Evaluation (Signed)
Anesthesia Post Note  Patient: Christy Davis  Procedure(s) Performed: Procedure(s) (LRB): Intrathecal pump replacement (N/A)  Patient location during evaluation: PACU Anesthesia Type: General Level of consciousness: awake and alert, oriented and patient cooperative Pain management: pain level controlled Vital Signs Assessment: post-procedure vital signs reviewed and stable Respiratory status: spontaneous breathing, nonlabored ventilation and respiratory function stable Cardiovascular status: blood pressure returned to baseline and stable Postop Assessment: no signs of nausea or vomiting Anesthetic complications: no       Last Vitals:  Vitals:   11/15/16 1158 11/15/16 1211  BP:  130/76  Pulse:  69  Resp:  16  Temp: 36.1 C     Last Pain:  Vitals:   11/15/16 1130  TempSrc:   PainSc: 4                  Rozlyn Yerby,E. Sherwin Hollingshed

## 2016-11-15 NOTE — Anesthesia Preprocedure Evaluation (Signed)
Anesthesia Evaluation  Patient identified by MRN, date of birth, ID band Patient awake    Reviewed: Allergy & Precautions, NPO status , Patient's Chart, lab work & pertinent test results  History of Anesthesia Complications (+) PONV  Airway Mallampati: I  TM Distance: >3 FB Neck ROM: Full    Dental  (+) Dental Advisory Given   Pulmonary COPD,  COPD inhaler, former smoker,    breath sounds clear to auscultation       Cardiovascular + dysrhythmias Atrial Fibrillation  Rhythm:Regular Rate:Normal  '16 ECHO: EF 60-65%, valves OK   Neuro/Psych Depression Chronic back pain    GI/Hepatic Neg liver ROS, GERD  Medicated and Controlled,  Endo/Other  Hypothyroidism   Renal/GU negative Renal ROS     Musculoskeletal  (+) Arthritis ,   Abdominal   Peds  Hematology eliquis (last dose 11/10/16)   Anesthesia Other Findings Breast cancer  Reproductive/Obstetrics                            Anesthesia Physical Anesthesia Plan  ASA: III  Anesthesia Plan: General   Post-op Pain Management:    Induction: Intravenous  Airway Management Planned: LMA  Additional Equipment:   Intra-op Plan:   Post-operative Plan:   Informed Consent: I have reviewed the patients History and Physical, chart, labs and discussed the procedure including the risks, benefits and alternatives for the proposed anesthesia with the patient or authorized representative who has indicated his/her understanding and acceptance.   Dental advisory given  Plan Discussed with: CRNA and Surgeon  Anesthesia Plan Comments: (Plan routine monitors, GA- LMA OK)        Anesthesia Quick Evaluation

## 2016-11-15 NOTE — H&P (Signed)
Christy Davis is an 76 y.o. female.   Chief Complaint: pudendal, rectal pain HPI: IT pump implant elsewhere for management of neuropathic pain, now at end of battery life. Christy Davis also c/o placement discomfort because it is right at her waistline.   Past Medical History:  Diagnosis Date  . Arthritis   . Asthma   . 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 Complication of anesthesia   . COPD (chronic obstructive pulmonary disease) ([place])   . Depression   . Dysrhythmia   . GERD (gastroesophageal reflux disease)   . Heart murmur    as a child  . Hypotension   . Hypothyroidism   . Neuromuscular disorder ([place])   . Neuropathy ([place])   . Pneumonia   . PONV (postoperative nausea and vomiting)    in past, not in past 2  years  . Shortness of breath dyspnea     Past Surgical History:  Procedure Laterality Date  . BACK SURGERY     fusion L1 - S 2  . BREAST SURGERY    . COLONOSCOPY    . EYE SURGERY Bilateral    cataract  . JOINT REPLACEMENT     right knee and right hip  . MASTECTOMY Bilateral   . pudendal nerve    . SPINAL CORD STIMULATOR INSERTION N/A 11/24/2015   Procedure: Spinal cord stimulator interregation of leads,  implantable pulse generator replacement;  Surgeon: Clydell Hakim, MD;  Location: [place] NEURO ORS;  Service: Neurosurgery;  Laterality: N/A;  Spinal cord stimulator interregation of leads,  implantable pulse generator replacement  . TONSILLECTOMY    . TOTAL SHOULDER REPLACEMENT Left 04/2016    History reviewed. No pertinent family history. Social History:  reports that she quit smoking about 41 years ago. She quit after 15.00 years of use. She has never used smokeless tobacco. She reports that she drinks about 0.6 oz of alcohol per week . She reports that she does not use drugs.  Allergies:  Allergies  Allergen Reactions  . Sucralfate Other (See Comments)    Burning Sensation  . Tapentadol Other (See Comments)   Doesn't tolerate well    Medications Prior to Admission  Medication Sig Dispense Refill  . acetaminophen (TYLENOL) 500 MG tablet Take 1,000 mg by mouth every 6 (six) hours as needed for moderate pain or headache.    Marland Kitchen apixaban (ELIQUIS) 5 MG TABS tablet Take 5 mg by mouth 2 (two) times daily.    Marland Kitchen CALCIUM PO Take 1,200 mg by mouth 2 (two) times daily.    . Calcium-Magnesium-Vitamin D (CALCIUM 1200+D3 PO) Take 1 tablet by mouth daily.    . Cholecalciferol (VITAMIN D3) LIQD 2,000 Units by Does not apply route daily.    Marland Kitchen dronedarone (MULTAQ) 400 MG tablet Take 400 mg by mouth 2 (two) times daily with a meal.    . Eszopiclone 3 MG TABS Take 1.5 mg by mouth daily as needed (sleep). Take immediately before bedtime     . fludrocortisone (FLORINEF) 0.1 MG tablet Take 0.15 mg by mouth daily.     . fluticasone (FLONASE) 50 MCG/ACT nasal spray Place 1 spray into both nostrils daily.    Marland Kitchen levothyroxine (SYNTHROID, LEVOTHROID) 75 MCG tablet Take 75 mcg by mouth at bedtime.    . montelukast (SINGULAIR) 10 MG tablet Take 10 mg by mouth at bedtime.    Marland Kitchen tiotropium (SPIRIVA) 18 MCG inhalation capsule Place 18 mcg into  inhaler and inhale daily.    . ziconotide (PRIALT) 500 MCG/20ML injection by Intrathecal route.    Marland Kitchen albuterol (PROVENTIL HFA;VENTOLIN HFA) 108 (90 Base) MCG/ACT inhaler Inhale 2 puffs into the lungs every 4 (four) hours as needed for wheezing or shortness of breath.     Marland Kitchen amoxicillin-clavulanate (AUGMENTIN) 875-125 MG tablet Take 1 tablet by mouth. Take 1 tablet before dental appointments and 1 tablet after    . diclofenac sodium (VOLTAREN) 1 % GEL Apply 4 g topically daily as needed (pain).       Results for orders placed or performed during the hospital encounter of 11/15/16 (from the past 48 hour(s))  APTT     Status: None   Collection Time: 11/15/16  7:37 AM  Result Value Ref Range   aPTT 25 24 - 36 seconds  Protime-INR     Status: None   Collection Time: 11/15/16  7:37 AM  Result  Value Ref Range   Prothrombin Time 13.1 11.4 - 15.2 seconds   INR 0.99    No results found.  Review of Systems  Constitutional: Negative.   HENT: Negative.   Eyes: Negative.   Respiratory: Negative.   Cardiovascular: Negative.   Gastrointestinal: Negative.   Genitourinary: Negative.   Musculoskeletal: Negative.   Skin: Negative.   Neurological: Negative.   Endo/Heme/Allergies: Negative.   Psychiatric/Behavioral: Negative.     Blood pressure 126/68, pulse 67, temperature 98.3 F (36.8 C), temperature source Oral, resp. rate 18, height 5' 3.5" (1.613 m), weight 53.4 kg (117 lb 11.6 oz), SpO2 97 %. Physical Exam  Constitutional: She is oriented to person, place, and time. She appears well-developed and well-nourished.  HENT:  Head: Normocephalic and atraumatic.  Eyes: EOM are normal. Pupils are equal, round, and reactive to light.  Neck: Normal range of motion.  Cardiovascular: Normal rate and regular rhythm.   Respiratory: Effort normal.  GI: Soft.  Musculoskeletal: Normal range of motion.  Neurological: She is alert and oriented to person, place, and time.  Skin: Skin is warm and dry.  Psychiatric: She has a normal mood and affect. Her behavior is normal. Judgment and thought content normal.     Assessment/Plan Pudendal neuralgia, IT Prialt therapy, with IT pump now at end of battery life PLAN: replace IT pump, revise pocket  Bonna Gains, MD 11/15/2016, 9:42 AM

## 2016-11-15 NOTE — Op Note (Signed)
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 The patient was taken to the OR, a plane of general anesthesia induced, and the patient intubated by the anesthesiology staff. The patient was then positioned in a supine position. The patient's abdomen, was prepped with chloraprep, and draped into a sterile field. A timeout was taken to verify the appropriate patient, position, procedure, availability of equipment and personnel, and administration of perioperative antibiotics.   The previous scar line over the IT pump was incised with a 10 blade, and careful sharp dissection carried down to the pump with the Metzenbaum scissors.  The pump was inspected, and good connection between the catheter and the pump side port connector noted.The rest of the catheter appeared to be intact. The old pump was disconnected and passed onto the back field, where the [employer] representative and the scrub nurse rinsed the new 59mL pump with drug, and then placed 6cc into the new reservoir using sterile technique.  A pump prime was then initiated.  While the new pump primed, the pocket was carefully modified in order for the pump to lie lower in the pelvis. Modification had been discussed with the patient in the preop area, and she indicated this more caudad orientation to be her preference.   With the pump primed, 0 silk retention sutures were then placed in the pocket, in postion to orient the pump tip to a 9-10 o'clock position, and the pump fixed in its new lower area with the retention sutures. Care was taken to make sure that the sutures did not incorporate the catheter.    The pocket was then  copiously irrigated with bacitracin-containing irrigation, and the more cephalad space of the pocket imbricated with with 0 silk suture to reduce free space and ensure the pump stayed in the more caudad space. The incision closed with 2 layers of 2-0 interrupted vicryl sutures, and the skin closed with staples. Bacitracin ointment and a sterile, occlusive dressings were placed.   Needle, instrument and sponge counts were correct x2 at the end of the case.   The patient was turned onto a stretcher, extubated by the anesthesiology team, and taken to PACU in stable condition.   DRAINS: none  COMPLICATIONS: none CONDITION: stable throughout, and to the PACU  DISPOSITION: discharge home after recovery. No abx. Pt. Fpllow up with me in 2 weeks for a wound check and removal of staples if appropriate. Case discussed with the patient's friend at her request. .

## 2016-11-15 NOTE — Transfer of Care (Signed)
Immediate Anesthesia Transfer of Care Note  Patient: Christy Davis  Procedure(s) Performed: Procedure(s) with comments: Intrathecal pump replacement (N/A) - Intrathecal pump replacement  Patient Location: PACU  Anesthesia Type:General  Level of Consciousness: awake, patient cooperative and responds to stimulation  Airway & Oxygen Therapy: Patient Spontanous Breathing and Patient connected to nasal cannula oxygen  Post-op Assessment: Report given to RN and Post -op Vital signs reviewed and stable  Post vital signs: Reviewed and stable  Last Vitals:  Vitals:   11/15/16 0808  BP: 126/68  Pulse: 67  Resp: 18  Temp: 36.8 C    Last Pain:  Vitals:   11/15/16 0808  TempSrc: Oral  PainSc:       Patients Stated Pain Goal: 2 ([id number])  Complications: No apparent anesthesia complications

## 2016-11-18 ENCOUNTER — Encounter (HOSPITAL_COMMUNITY): Payer: Self-pay | Admitting: Anesthesiology

## 2017-08-29 IMAGING — CT CT L SPINE W/ CM
3 of 16 series · 9 of 33 positions shown, 10 images · non-contrast
Comparison: none

CLINICAL DATA: NECK PAIN. MID BACK PAIN. LOW BACK PAIN. POST
LAMINECTOMY SYNDROME. SPINAL CORD STIMULATOR. INFUSION PUMP.
TECHNIQUE: Contiguous axial images were obtained through the Cervical,
Thoracic, and Lumbar spine after the intrathecal infusion of
infusion. Coronal and sagittal reconstructions were obtained of the
axial image sets.

[Series 3: t & l soft (person_name) · axial · 0.27mm/px · z∈[-568,-58]mm · 3 of 171 slices shown, 4 images]
[im 1/171  soft-tissue]
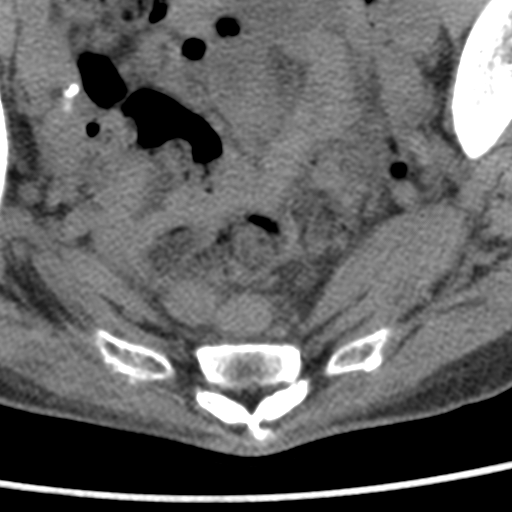
[im 1/171  bone]
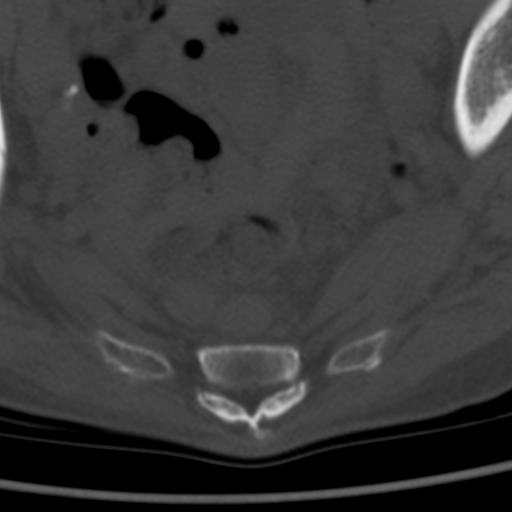
[im 86/171  bone]
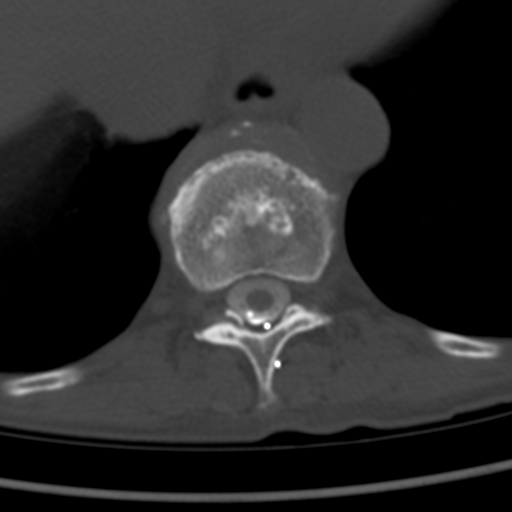
[im 171/171  bone]
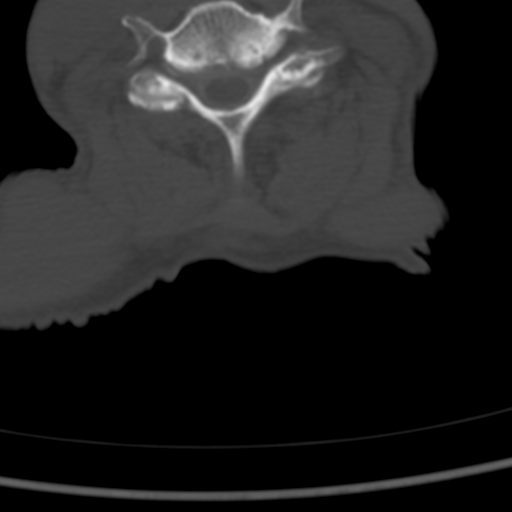

[Series 13: cor l spine · coronal · 0.41mm/px · 1 of 53 slices shown]
[im 27/53  bone]
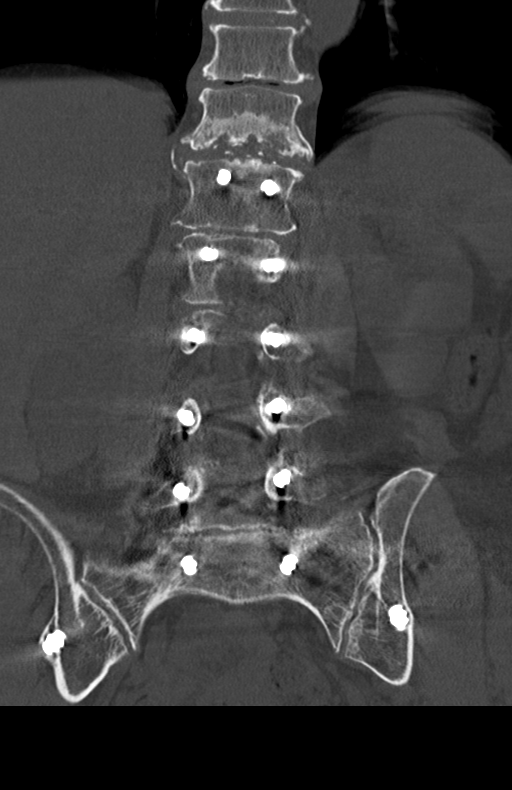

[Series 14: sag l spine · sagittal · 0.32mm/px · 5 of 49 slices shown]
[im 9/49  bone]
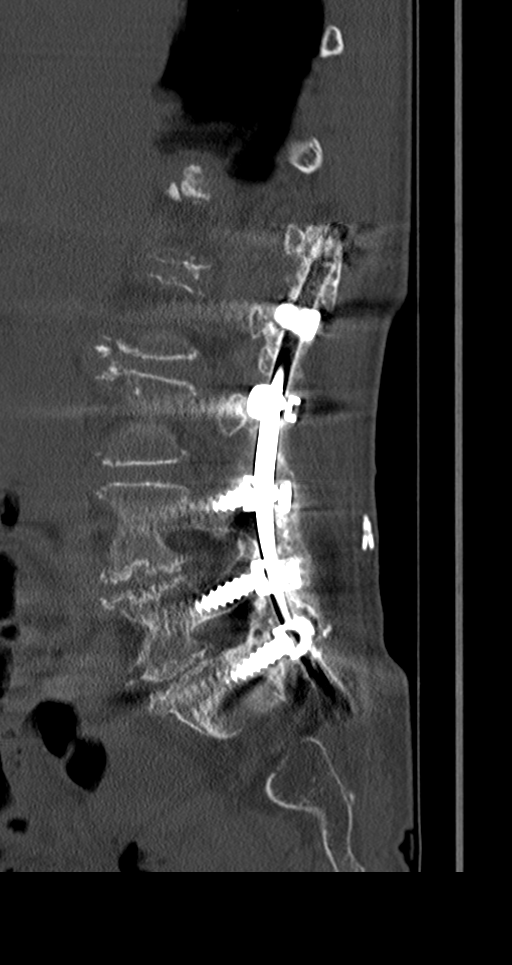
[im 17/49  bone]
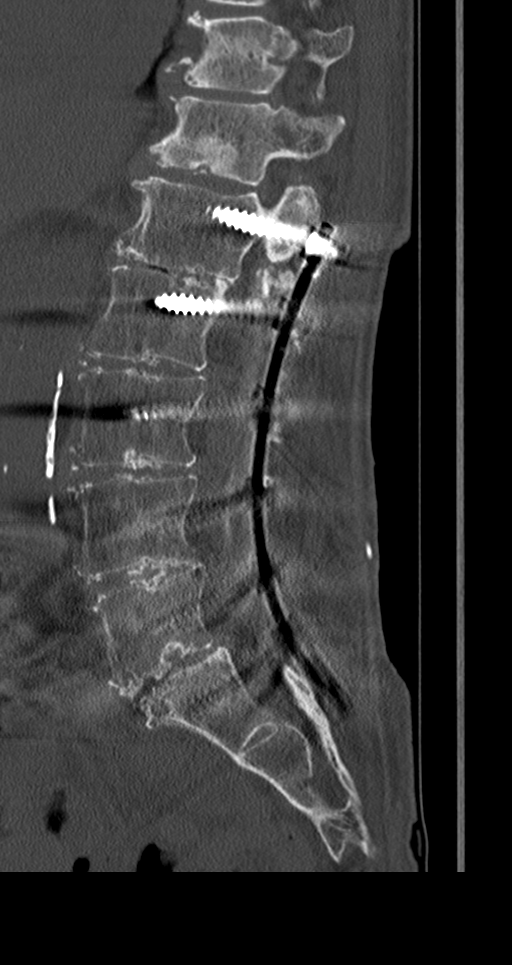
[im 25/49  bone]
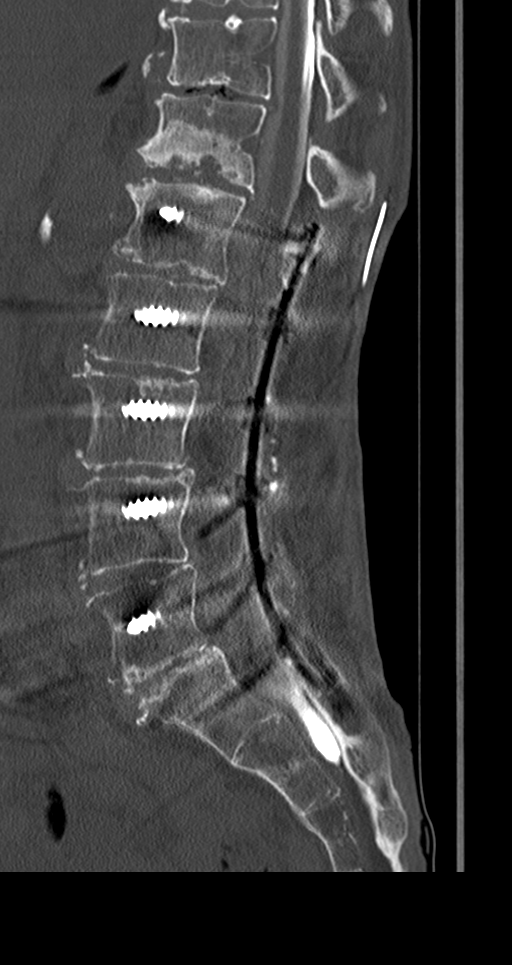
[im 33/49  bone]
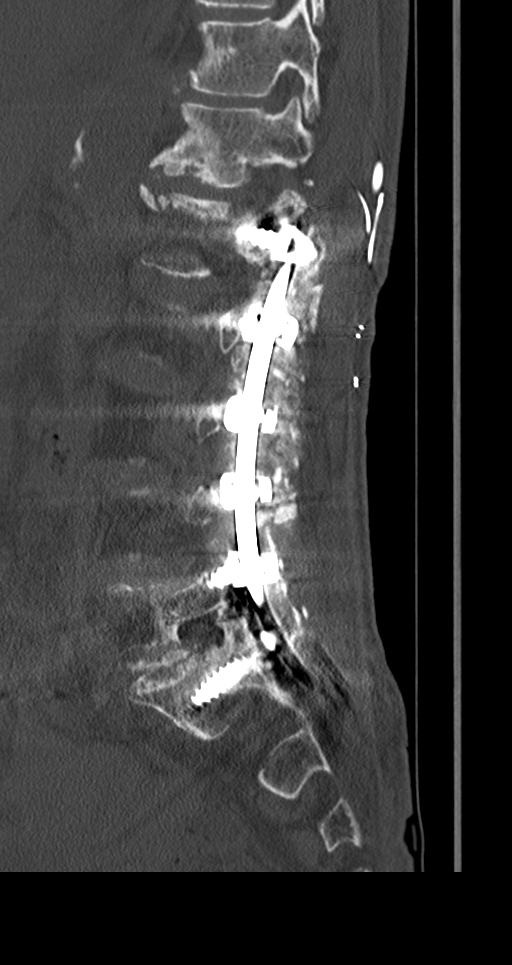
[im 41/49  bone]
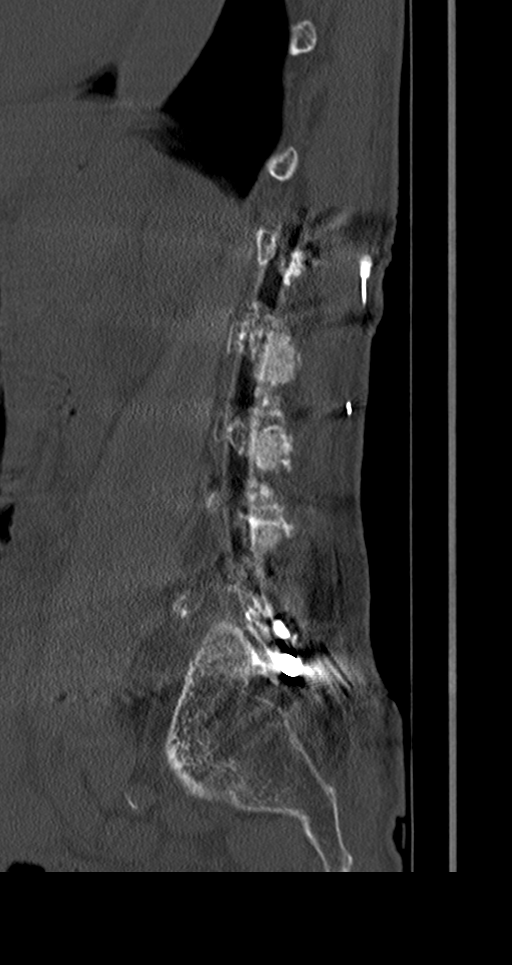

[9 of 33 positions shown; findings below may reference images not displayed]

FLUOROSCOPY TIME:  1 minutes and 59 seconds.  32 spot films.

PROCEDURE:
LUMBAR PUNCTURE FOR CERVICAL LUMBAR AND THORACIC MYELOGRAM

CERVICAL AND LUMBAR AND THORACIC MYELOGRAM

CT CERVICAL MYELOGRAM

CT LUMBAR MYELOGRAM

CT THORACIC MYELOGRAM

After thorough discussion of risks and benefits of the procedure
including bleeding, infection, injury to nerves, blood vessels,
adjacent structures as well as headache and CSF leak, written and
oral informed consent was obtained. Consent was obtained by Dr. Awa
Debarro.

Patient was positioned prone on the fluoroscopy table. Local
anesthesia was provided with 1% lidocaine without epinephrine after
prepped and draped in the usual sterile fashion. Puncture was
performed at L3-4 using a 3 1/2 inch 22-gauge spinal needle via
midline approach. Using a single pass through the dura, the needle
was placed within the thecal sac, with return of clear CSF. 10 mL
Lmnipaque-U33 was injected into the thecal sac, with normal
opacification of the nerve roots and cauda equina consistent with
free flow within the subarachnoid space. The patient was then moved
to the trendelenburg position and contrast flowed into the Thoracic
and Cervical spine regions.

I personally performed the lumbar puncture and administered the
intrathecal contrast. I also personally supervised acquisition of
the myelogram images.
FINDINGS: CERVICAL THORACIC AND LUMBAR MYELOGRAM FINDINGS:

LUMBAR: Opacification lumbar subarachnoid space reveals previous L1
through sacrum fusion augmented with iliac screws. There is
overlying intrathecal infusion pump and overlying spinal cord
stimulator which obscures detail of the lower lumbar region.
Hardware appears intact. No lumbar stenosis or significant
extradural defect is evident. There is diffuse disc space narrowing
from L1 through S1. Trace anterolisthesis L5-S1.

THORACIC: Good opacification of the thoracic subarachnoid space.
There is a ventral extradural defect at T8-9 which is partially
calcified, slightly flattening the cord. Disc space narrowing is
present throughout the thoracic region, with sclerosis of the
endplates above and below T12-L1. No conus compression. No thoracic
compression deformity. Dorsal column stimulator leads are seen
extending up to T6 on the LEFT and T8 on the RIGHT.

CERVICAL: Good opacification of the cervical subarachnoid space.
Diffuse disc space narrowing. No cord compression. Mild stenosis at
C4-5 and C5-6 with effacement of the exiting nerve roots at those
levels.

CT CERVICAL MYELOGRAM FINDINGS:

No neck masses. Airway midline. No worrisome osseous findings.
Carotid atherosclerosis.

The individual disc spaces were examined as follows:

C2-3: Severe disc space narrowing. Endplate reactive changes. LEFT
greater than RIGHT facet arthropathy. LEFT-sided uncinate spurring.
LEFT C3 nerve root impingement is possible.

C3-4: Severe disc space narrowing. Central disc osteophyte complex
extends more to the RIGHT than LEFT. BILATERAL facet arthropathy.
RIGHT C4 nerve root impingement is possible.

C4-5: Disc space narrowing. Severe BILATERAL uncinate spurring and
mild stenosis without cord compression. BILATERAL C5 nerve root
impingement is likely.

C5-6: Disc space narrowing. BILATERAL uncinate spurring greater on
the LEFT. BILATERAL facet arthropathy. Mild stenosis with BILATERAL
C6 nerve root impingement.

C6-7: Advanced disc space narrowing. Central disc osteophyte
complex. BILATERAL uncinate spurring. Either C7 nerve root could be
affected.

C7-T1: Disc space narrowing. Mild facet arthropathy. Trace
anterolisthesis. No impingement.

CT LUMBAR MYELOGRAM FINDINGS:

No prevertebral or paraspinous masses. L1 through S1 fusion is
augmented with iliac screws. No loosening or hardware malposition.

L1-L2: Intrathecal catheter courses cephalad. Adequate posterior
decompression. Posterior arthrodesis. Pedicle screws. Disc space
narrowing. No impingement.

L2-L3: Disc space narrowing. Posterior arthrodesis. Pedicle screws.
Adequate posterior decompression. No impingement.

L3-L4: Disk space narrowing. Pedicle screws. Posterior arthrodesis.
Adequate decompression. No impingement.

L4-L5: Disc space narrowing. Pedicle screws. Posterior arthrodesis.
Adequate decompression. No impingement.

L5-S1: Disc space narrowing. Pedicle screws. Trace anterolisthesis.
Posterior arthrodesis. Adequate decompression. No impingement.

SI joints are open.  Screws appear appropriately located.

CT THORACIC MYELOGRAM FINDINGS: No lung masses or pleural effusion.
Coronary atherosclerosis. No paravertebral masses. No worrisome
osseous findings.

The individual disc spaces are examined as follows:

T1-2: Central and rightward protrusion with disc osteophyte complex.
Mild stenosis. Subchondral cyst formation particularly on the RIGHT.
Mild facet arthropathy. RIGHT T1 nerve root impingement is possible.

T2-3: Mild annular bulging. Severe RIGHT-sided facet arthropathy. No
impingement.

T3-4: Unremarkable disc space. Mild facet arthropathy. No
impingement.

T4-5:  Small calcific central protrusion without impingement.

T5-6: Disc space narrowing. Calcified disc RIGHT paramedian
location. RIGHT-sided nerve root irritation is possible.

T6-7: Disc space narrowing. Asymmetric ligamentum flavum
calcification on the LEFT. No impingement.

T7-8: Small calcific protrusion centrally effaces the anterior
subarachnoid space but does not result in cord compression.
Intrathecal catheter terminates to the RIGHT of T8.

T8-9:  LEFT paramedian calcific protrusion.  No impingement.

T9-10:  Normal.

T10-11: Osseous spurring. Schmorl's nodes. No posterior protrusion
or impingement. BILATERAL calcified ligamentum flavum is
noncompressive.

T11-12: BILATERAL calcified ligamentum flavum. Broad-based disc
protrusion. No impingement.

T12-L1: Severe disc space narrowing. Calcified central protrusion.
No impingement.
IMPRESSION: Cervical, thoracic, and lumbar spondylosis as described without
significant spinal stenosis or compressive lesion.

Intrathecal catheter terminates at T8 on the RIGHT. Dorsal column
stimulator also terminates in the thoracic region. No significant
cord displacement or arachnoiditis as result of such.

Status post L1 through the sacrum fusion augmented with iliac
screws. No hardware loosening or pseudarthrosis.

## 2020-12-05 NOTE — Progress Notes (Signed)
New Patient Note  RE: Christy Davis MRN: [id number] DOB: 05-24-1941 Date of Office Visit: 12/06/2020  Referring provider: No ref. provider found Primary care provider: Burman Freestone, MD  Chief Complaint: rhinitis  History of Present Illness: I had the pleasure of seeing Christy Davis for initial evaluation at the [facility] on 12/06/2020. She is a 80 y.o. female, who is self-referred here for the evaluation of rhinitis.  Rhinitis: She reports symptoms of nasal congestion, rhinorrhea, itchy/watery eyes, itchy nose, voice hoarseness. Symptoms have been going on for many years but worse the last few years since she moved to a new retirement home. The symptoms are present all year around. Other triggers include exposure to unknown. Anosmia: no. Headache: no. She has used ocean spray, Flonase, OTC antihistamines (Claritin, Allegra) with minimal improvement in symptoms. Sinus infections: no. Previous work up includes: skin testing over 10 years ago was negative per patient report. Previous ENT evaluation: saw Dr. Laurance Flatten for her ears. Previous sinus imaging: no. History of nasal polyps: no. Last eye exam: goes every 4 months for macular degeneration - dry. History of reflux: in the past but not taking any medications currently.  Asthma/copd: Patient follows with pulmonology Dr. Camillo Flaming.  She reports symptoms of chest tightness, shortness of breath, coughing, wheezing for 5 years. Current medications include Spiriva and Breo daily which help. She tried the following inhalers: no. Main triggers are exertion. In the last month, frequency of symptoms: 5-6x/week. Frequency of SABA use: 5-6x/week. Interference with physical activity: no. Sleep is undisturbed. In the last 12 months, emergency room visits/urgent care visits/doctor office visits or hospitalizations due to respiratory issues: no. In the last 12 months, oral steroids courses: no. Lifetime history of hospitalization  for respiratory issues: no. Prior intubations: no. History of pneumonia: not recently. She was evaluated by allergist/pulmonologist in the past. Smoking exposure: quit 20+ years ago. Up to date with flu vaccine: yes. Up to date with pneumonia vaccine: yes. Up to date with COVID-19 vaccine: yes.  Patient wears CPAP machine at night.   Assessment and Plan: Christy Davis is a 80 y.o. female with: Other allergic rhinitis Perennial rhinitis symptoms for many years but worse since moved to a new retirement home and having voice hoarseness.  History of reflux. Tried [employer], Flonase and over-the-counter antihistamines with minimal benefit.  Skin testing over 10 years ago was negative per patient report.  Follows with ENT for her ear issues.  Diagnosed with macular degeneration.  Today's skin prick testing showed: Borderline positive to ragweed and one mold. Results given.   Discussed with patient that it is unlikely that the above allergens are causing her current rhinitis symptoms.  May use azelastine nasal spray 1-2 sprays per nostril twice a day as needed for runny nose/drainage.  Nasal saline spray (i.e., Simply Saline) or nasal saline lavage (i.e., NeilMed) is recommended as needed and prior to medicated nasal sprays.  No oral antihistamines due to issues with dry eyes.   If no improvement with above nasal spray then recommend to follow up with ENT next.   Asthma with COPD [facility]) Patient follows with pulmonology.  Continue inhalers as per your pulmonologist - Dr. Camillo Flaming.  Make sure to rinse mouth after using inhalers.   Seborrheic dermatitis of scalp Itchy dry scalp for many years.  Seen by dermatology in the past.  Use fragrance free and dye free shampoo and conditioner.  Limit hair washing to every other day as daily washing  may cause worsening dryness.   Return in about 3 months (around 03/05/2021).  Meds ordered this encounter  Medications  . azelastine (ASTELIN) 0.1 % nasal spray     Sig: Place 1-2 sprays into both nostrils 2 (two) times daily as needed (nasal drainage). Use in each nostril as directed    Dispense:  30 mL    Refill:  5   Lab Orders  No laboratory test(s) ordered today    Other allergy screening: Food allergy: no Hymenoptera allergy: no Urticaria: no Eczema:no History of recurrent infections suggestive of immunodeficency: no  Diagnostics: Spirometry:  Tracings reviewed. Her effort: It was hard to get consistent efforts and there is a question as to whether this reflects a maximal maneuver. FVC: 1.80L FEV1: 1.40L, 71% predicted FEV1/FVC ratio: 78% Interpretation: Spirometry consistent with possible restrictive disease.  Please see scanned spirometry results for details.  Skin Testing: Environmental allergy panel. Borderline positive to ragweed and one mold.  Results discussed with patient/family.  Airborne Adult Perc - 12/06/20 1024    Time Antigen Placed 1024    Allergen Manufacturer Greer    Location Back    Number of Test 59    Panel 1 Select    1. Control-Buffer 50% Glycerol Negative    2. Control-Histamine 1 mg/ml 3+    3. Albumin saline Negative    4. Delta Negative    5. [place] Negative    6. Johnson Negative    7. Cutten Blue Negative    8. Meadow Fescue Negative    9. Perennial Rye Negative    10. Sweet Vernal Negative    11. Timothy Negative    12. Cocklebur Negative    13. Burweed Marshelder Negative    14. Ragweed, short --   +/-   15. Ragweed, Giant Negative    16. Plantain,  English Negative    17. Lamb's Quarters Negative    18. Sheep Sorrell Negative    19. Rough Pigweed Negative    20. Marsh Elder, Rough Negative    21. Mugwort, Common Negative    22. Ash mix Negative    23. Birch mix Negative    24. Beech American Negative    25. Box, Elder Negative    26. Cedar, red Negative    27. Cottonwood, [place] Negative    28. Elm mix Negative    29. Hickory Negative    30. Maple mix Negative    31. Oak,  [place] mix Negative    32. Pecan Pollen Negative    33. Pine mix Negative    34. Sycamore Eastern Negative    35. [place], Black Pollen Negative    36. Alternaria alternata Negative    37. Cladosporium Herbarum Negative    38. Aspergillus mix Negative    39. Penicillium mix Negative    40. Bipolaris sorokiniana (Helminthosporium) Negative    41. Drechslera spicifera (Curvularia) Negative    42. Mucor plumbeus Negative    43. Fusarium moniliforme --   +/-   44. Aureobasidium pullulans (pullulara) Negative    45. Rhizopus oryzae Negative    46. Botrytis cinera Negative    47. Epicoccum nigrum Negative    48. Phoma betae Negative    49. Candida Albicans Negative    50. Trichophyton mentagrophytes Negative    51. Mite, D Farinae  5,000 AU/ml Negative    52. Mite, D Pteronyssinus  5,000 AU/ml Negative    53. Cat Hair 10,000 BAU/ml Negative    54.  Dog Epithelia Negative    55. Mixed Feathers Negative    56. Horse Epithelia Negative    57. Cockroach, German Negative    58. Mouse Negative    59. Tobacco Leaf Negative    Comments nnnn           Past Medical History: Patient Active Problem List   Diagnosis Date Noted  . Asthma with COPD ([place]) 12/06/2020  . Other allergic rhinitis 12/06/2020  . Seborrheic dermatitis of scalp 12/06/2020   Past Medical History:  Diagnosis Date  . Arthritis   . Asthma   . 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 Complication of anesthesia   . COPD (chronic obstructive pulmonary disease) ([place])   . Depression   . Dysrhythmia   . GERD (gastroesophageal reflux disease)   . Heart murmur    as a child  . Hypotension   . Hypothyroidism   . Neuromuscular disorder ([place])   . Neuropathy   . Pneumonia   . PONV (postoperative nausea and vomiting)    in past, not in past 2  years  . Shortness of breath dyspnea    Past Surgical History: Past Surgical History:  Procedure Laterality Date  . BACK SURGERY      fusion L1 - S 2  . BREAST SURGERY    . COLONOSCOPY    . EYE SURGERY Bilateral    cataract  . JOINT REPLACEMENT     right knee and right hip  . MASTECTOMY Bilateral   . PAIN PUMP REVISION N/A 11/15/2016   Procedure: Intrathecal pump replacement;  Surgeon: Clydell Hakim, MD;  Location: [facility];  Service: Neurosurgery;  Laterality: N/A;  Intrathecal pump replacement  . pudendal nerve    . SPINAL CORD STIMULATOR INSERTION N/A 11/24/2015   Procedure: Spinal cord stimulator interregation of leads,  implantable pulse generator replacement;  Surgeon: Clydell Hakim, MD;  Location: [facility];  Service: Neurosurgery;  Laterality: N/A;  Spinal cord stimulator interregation of leads,  implantable pulse generator replacement  . TONSILLECTOMY    . TOTAL SHOULDER REPLACEMENT Left 04/2016   Medication List:  Current Outpatient Medications  Medication Sig Dispense Refill  . acetaminophen (TYLENOL) 500 MG tablet Take 1,000 mg by mouth every 6 (six) hours as needed for moderate pain or headache.    . albuterol (PROVENTIL HFA;VENTOLIN HFA) 108 (90 Base) MCG/ACT inhaler Inhale 2 puffs into the lungs every 4 (four) hours as needed for wheezing or shortness of breath.     Marland Kitchen apixaban (ELIQUIS) 5 MG TABS tablet Take 5 mg by mouth 2 (two) times daily.    Marland Kitchen azelastine (ASTELIN) 0.1 % nasal spray Place 1-2 sprays into both nostrils 2 (two) times daily as needed (nasal drainage). Use in each nostril as directed 30 mL 5  . celecoxib (CELEBREX) 200 MG capsule Take 200 mg by mouth as needed.    . Cholecalciferol (VITAMIN D3) LIQD 2,000 Units by Does not apply route daily.    . Ciclopirox 1 % shampoo Apply topically 2 (two) times a week.    . clobetasol (TEMOVATE) 0.05 % external solution APPLY TWICE DAILY ON AFFECTED AREA(S) OFSCALP    . diclofenac sodium (VOLTAREN) 1 % GEL Apply 4 g topically daily as needed (pain).     . fludrocortisone (FLORINEF) 0.1 MG tablet Take 0.2 mg by mouth daily.    . fluticasone (FLONASE) 50  MCG/ACT nasal spray Place 1 spray into both nostrils  as needed.    . fluticasone furoate-vilanterol (BREO ELLIPTA) 200-25 MCG/INH AEPB Inhale 1 puff into the lungs daily.    Marland Kitchen levothyroxine (SYNTHROID, LEVOTHROID) 75 MCG tablet Take 75 mcg by mouth at bedtime.    . lidocaine (XYLOCAINE) 5 % ointment in the morning and at bedtime.    . montelukast (SINGULAIR) 10 MG tablet Take 10 mg by mouth at bedtime.    . Oregano 1500 MG CAPS Take by mouth as needed.    . propafenone (RYTHMOL) 150 MG tablet Take 150 mg by mouth in the morning and at bedtime.    Marland Kitchen tiotropium (SPIRIVA) 18 MCG inhalation capsule Place 18 mcg into inhaler and inhale daily.    . ziconotide (PRIALT) 500 MCG/20ML injection by Intrathecal route.     No current facility-administered medications for this visit.   Allergies: Allergies  Allergen Reactions  . Sucralfate Other (See Comments)    Burning Sensation  . Tapentadol Other (See Comments)    Doesn't tolerate well   Social History: Social History   Socioeconomic History  . Marital status: Unknown    Spouse name: Not on file  . Number of children: Not on file  . Years of education: Not on file  . Highest education level: Not on file  Occupational History  . Not on file  Tobacco Use  . Smoking status: Former Smoker    Years: 15.00    Quit date: 09/15/1975    Years since quitting: 45.2  . Smokeless tobacco: Never Used  Vaping Use  . Vaping Use: Never used  Substance and Sexual Activity  . Alcohol use: Yes    Alcohol/week: 1.0 standard drink    Types: 1 Shots of liquor per week    Comment: once weekly   . Drug use: No  . Sexual activity: Not on file  Other Topics Concern  . Not on file  Social History Narrative  . Not on file   Social Determinants of Health   Financial Resource Strain: Not on file  Food Insecurity: Not on file  Transportation Needs: Not on file  Physical Activity: Not on file  Stress: Not on file  Social Connections: Not on file    Lives in a retirement community. Smoking: quit over 20 years ago Occupation: not employed  Environmental HistoryFreight forwarder in the house: no Charity fundraiser in the family room: yes Carpet in the bedroom: yes Heating: electric Cooling: central Pet: yes 1 dog x 12 yrs  Family History: Family History  Problem Relation Age of Onset  . Allergic rhinitis Neg Hx   . Angioedema Neg Hx   . Asthma Neg Hx   . Atopy Neg Hx   . Eczema Neg Hx   . Immunodeficiency Neg Hx   . Urticaria Neg Hx    Review of Systems  Constitutional: Negative for appetite change, chills, fever and unexpected weight change.  HENT: Positive for congestion, postnasal drip, rhinorrhea, sneezing and voice change. Negative for sore throat.   Eyes: Positive for itching.  Respiratory: Positive for shortness of breath. Negative for cough, chest tightness and wheezing.   Cardiovascular: Negative for chest pain.  Gastrointestinal: Negative for abdominal pain.  Genitourinary: Negative for difficulty urinating.  Skin: Positive for rash.  Neurological: Negative for headaches.   Objective: BP (!) 124/50   Pulse 92   Temp 97.9 F (36.6 C) (Tympanic)   Resp 16   Ht 5' 3.78" (1.62 m)   Wt 127 lb 3.2 oz (57.7 kg)  SpO2 96%   BMI 21.98 kg/m  Body mass index is 21.98 kg/m. Physical Exam Vitals and nursing note reviewed.  Constitutional:      Appearance: Normal appearance. She is well-developed.  HENT:     Head: Normocephalic and atraumatic.     Right Ear: Tympanic membrane and external ear normal.     Left Ear: Tympanic membrane and external ear normal.     Nose: Nose normal.     Mouth/Throat:     Mouth: Mucous membranes are moist.     Pharynx: Oropharynx is clear.  Eyes:     Conjunctiva/sclera: Conjunctivae normal.  Cardiovascular:     Rate and Rhythm: Normal rate and regular rhythm.     Heart sounds: Normal heart sounds. No murmur heard. No friction rub. No gallop.   Pulmonary:     Effort: Pulmonary  effort is normal.     Breath sounds: Normal breath sounds. No wheezing, rhonchi or rales.  Musculoskeletal:     Cervical back: Neck supple.  Skin:    General: Skin is warm and dry.     Findings: Rash present.     Comments: Erythematous patches/dry patches on the scalp.  Neurological:     Mental Status: She is alert and oriented to person, place, and time.  Psychiatric:        Behavior: Behavior normal.    The plan was reviewed with the patient/family, and all questions/concerned were addressed.  It was my pleasure to see Christy Davis today and participate in her care. Please feel free to contact me with any questions or concerns.  Sincerely,  Rexene Alberts, DO Allergy & Immunology  [facility] office: [place] office: [phone]

## 2020-12-06 ENCOUNTER — Other Ambulatory Visit: Payer: Self-pay

## 2020-12-06 ENCOUNTER — Encounter: Payer: Self-pay | Admitting: Allergy

## 2020-12-06 ENCOUNTER — Ambulatory Visit (INDEPENDENT_AMBULATORY_CARE_PROVIDER_SITE_OTHER): Payer: Medicare Other | Admitting: Allergy

## 2020-12-06 VITALS — BP 124/50 | HR 92 | Temp 97.9°F | Resp 16 | Ht 63.78 in | Wt 127.2 lb

## 2020-12-06 DIAGNOSIS — J449 Chronic obstructive pulmonary disease, unspecified: Secondary | ICD-10-CM | POA: Diagnosis not present

## 2020-12-06 DIAGNOSIS — L219 Seborrheic dermatitis, unspecified: Secondary | ICD-10-CM

## 2020-12-06 DIAGNOSIS — J4489 Other specified chronic obstructive pulmonary disease: Secondary | ICD-10-CM | POA: Insufficient documentation

## 2020-12-06 DIAGNOSIS — J3089 Other allergic rhinitis: Secondary | ICD-10-CM

## 2020-12-06 MED ORDER — AZELASTINE HCL 0.1 % NA SOLN
1.0000 | Freq: Two times a day (BID) | NASAL | 5 refills | Status: DC | PRN
Start: 2020-12-06 — End: 2023-06-09

## 2020-12-06 NOTE — Patient Instructions (Addendum)
Today's skin testing showed: Borderline positive to ragweed and one mold.  Results given.    I don't think the above allergens are causing your sinus symptoms.   May use azelastine nasal spray 1-2 sprays per nostril twice a day as needed for runny nose/drainage.  Nasal saline spray (i.e., Simply Saline) or nasal saline lavage (i.e., NeilMed) is recommended as needed and prior to medicated nasal sprays.  Scalp:  Use fragrance free and dye free shampoo and conditioner.  Asthma/copd:  Continue inhalers as per your pulmonologist - Dr. Camillo Flaming.  Make sure to rinse mouth after using your inhalers.   Follow up in 3 months or sooner if needed.

## 2020-12-06 NOTE — Assessment & Plan Note (Signed)
Itchy dry scalp for many years.  Seen by dermatology in the past.  Use fragrance free and dye free shampoo and conditioner.  Limit hair washing to every other day as daily washing may cause worsening dryness.

## 2020-12-06 NOTE — Assessment & Plan Note (Addendum)
Perennial rhinitis symptoms for many years but worse since moved to a new retirement home and having voice hoarseness.  History of reflux. Tried [employer], Flonase and over-the-counter antihistamines with minimal benefit.  Skin testing over 10 years ago was negative per patient report.  Follows with ENT for her ear issues.  Diagnosed with macular degeneration.  Today's skin prick testing showed: Borderline positive to ragweed and one mold. Results given.   Discussed with patient that it is unlikely that the above allergens are causing her current rhinitis symptoms.  May use azelastine nasal spray 1-2 sprays per nostril twice a day as needed for runny nose/drainage.  Nasal saline spray (i.e., Simply Saline) or nasal saline lavage (i.e., NeilMed) is recommended as needed and prior to medicated nasal sprays.  No oral antihistamines due to issues with dry eyes.   If no improvement with above nasal spray then recommend to follow up with ENT next.

## 2020-12-06 NOTE — Assessment & Plan Note (Signed)
Patient follows with pulmonology.  Continue inhalers as per your pulmonologist - Dr. Camillo Flaming.  Make sure to rinse mouth after using inhalers.

## 2021-02-26 ENCOUNTER — Ambulatory Visit: Payer: Medicare Other | Attending: Critical Care Medicine

## 2021-02-26 ENCOUNTER — Other Ambulatory Visit: Payer: Medicare Other

## 2021-02-26 DIAGNOSIS — Z20822 Contact with and (suspected) exposure to covid-19: Secondary | ICD-10-CM

## 2021-02-27 LAB — NOVEL CORONAVIRUS, NAA: SARS-CoV-2, NAA: DETECTED — AB

## 2021-02-28 ENCOUNTER — Telehealth: Payer: Self-pay | Admitting: Adult Health

## 2021-02-28 NOTE — Telephone Encounter (Signed)
Called to discuss with patient about COVID-19 symptoms and the use of one of the available treatments for those with mild to moderate Covid symptoms and at a high risk of hospitalization.  Pt appears to qualify for outpatient treatment due to co-morbid conditions and/or a member of an at-risk group in accordance with the FDA Emergency Use Authorization.    Symptom onset: 02/24/2021 Vaccinated: yes Booster? yes Immunocompromised? no Qualifiers: age, chronic lung issues, h/o tobacco NIH Criteria: 3  Declines therapy.  Reviewed isolation and quarantine recommendations, along with recommendations against retesting.    Scot Dock

## 2021-09-19 NOTE — Progress Notes (Signed)
Office Visit Note  Patient: Christy Davis             Date of Birth: 1941-07-10           MRN: [id number]             PCP: Burman Freestone, MD Referring: Burman Freestone, MD Visit Date: 09/26/2021 Occupation: _0 @  Subjective:  New Patient (Initial Visit) (Abnormal labs, left sided pain from waste down)   History of Present Illness: Christy Davis is a 80 y.o. female seen in consultation per request of her PCP.  According the patient she was diagnosed with disc disease of the cervical spine in her 19s and underwent laminectomy.  In her 65s she started having lower back pain and the neck and lower back pain got worse over the years.  She underwent lumbar spine fusion in 2010.  She had a rotator cuff tear and had surgery for it in 2011.  She underwent left reverse total shoulder replacement in 2017 by an orthopedic surgeon in [facility].  She continues to have discomfort in her left shoulder joint.  She is asymptomatic in her right shoulder.  She states none of the other joints were painful.  About 7- 8 years ago she started having increased shortness of breath and fatigue.  At the time she was diagnosed with COPD and pulmonary hypertension.  She has been followed by a pulmonologist and a cardiologist.  She continues to have fatigue and shortness of breath.  She is ex-smoker and smoked for 15 years in the past.  She states on April 13, 2021 she could not get out of the bed due to severe pain and discomfort all over her body.  She tried Tylenol for a while without much relief.  She was seen by her PCP.  Twice in August 2022 she had labs which showed elevated sedimentation rate and elevated C-reactive protein.  Her CK was normal and ANA was negative.  She was referred to Dr. Earnest Conroy in [facility] and was seen by her PA.  She states that she was diagnosed with polymyalgia rheumatica and was placed on prednisone 5 mg p.o. daily for 2 weeks without much help.  The dose of prednisone was gradually  increased to 10 mg.  She has been on 10 mg of prednisone for a month now.  She has noticed some improvement.  She still continues to have fatigue which she had from COPD.  She has not noticed much improvement in her pain.  She states that her left side of her body especially her left hip, left knee, left ankle and left foot has been painful.  She has not noticed any joint swelling.  She always had difficulty climbing stairs due to COPD.  She denies any difficulty using a hair dryer.  She also states that she has to rock herself out of the chair which is new for her.  Activities of Daily Living:  Patient reports morning stiffness for 24 hours.   Patient Reports nocturnal pain.  Difficulty dressing/grooming: Reports Difficulty climbing stairs: Reports Difficulty getting out of chair: Reports Difficulty using hands for taps, buttons, cutlery, and/or writing: Reports  Review of Systems  Constitutional:  Positive for fatigue.  HENT:  Positive for mouth dryness.   Eyes:  Positive for dryness.  Respiratory:  Positive for shortness of breath.   Cardiovascular:  Positive for swelling in legs/feet.  Gastrointestinal:  Negative for constipation.  Endocrine: Positive for excessive thirst.  Genitourinary:  Negative for difficulty urinating.  Musculoskeletal:  Positive for joint pain, gait problem, joint pain, muscle weakness, morning stiffness and muscle tenderness.  Skin:  Negative for color change, redness and sensitivity to sunlight.  Allergic/Immunologic: Negative for susceptible to infections.  Neurological:  Positive for numbness and weakness.  Hematological:  Positive for bruising/bleeding tendency. Negative for swollen glands.  Psychiatric/Behavioral:  Positive for sleep disturbance. The patient is nervous/anxious.    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/2021   Primary insomnia 09/26/2021   Chronic pain syndrome 09/26/2021   Age-related osteoporosis without current pathological fracture 09/26/2021   DDD (degenerative disc disease), lumbar 09/26/2021   DDD (degenerative disc disease), cervical 09/26/2021   Idiopathic peripheral neuropathy 09/26/2021   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/26/2021   Primary osteoarthritis of left shoulder 09/26/2021   Asthma with COPD ([place]) 12/06/2020   Other allergic rhinitis 12/06/2020   Seborrheic dermatitis of scalp 12/06/2020    Past Medical History:  Diagnosis Date   A-fib ([place])    Arthritis    Asthma    Cancer ([place])    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   Complication of anesthesia    COPD (chronic obstructive pulmonary disease) (HCC)    Depression    Dysrhythmia    GERD (gastroesophageal reflux disease)    Heart murmur    as a child   Hypotension    Hypothyroidism    Neuromuscular disorder ([place])    Neuropathy    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)    Pneumonia    Polymyalgia rheumatica (HCC)    PONV (postoperative nausea and vomiting)    in past, not in past 2  years   Shortness of breath dyspnea     Family History  Problem Relation Age of Onset   Stroke Father    Stroke Sister    Allergic rhinitis Neg Hx    Angioedema Neg Hx    Asthma Neg Hx    Atopy Neg Hx    Eczema Neg Hx    Immunodeficiency Neg Hx    Urticaria Neg Hx    Past Surgical History:  Procedure Laterality Date   BACK SURGERY     fusion L1 - S 2   BREAST SURGERY     COLONOSCOPY     EYE SURGERY Bilateral    cataract   JOINT REPLACEMENT     right knee and right hip   MASTECTOMY Bilateral    PAIN PUMP REVISION N/A 11/15/2016   Procedure:  Intrathecal pump replacement;  Surgeon: Clydell Hakim, MD;  Location: [place];  Service: Neurosurgery;  Laterality: N/A;  Intrathecal pump replacement   pudendal nerve     SPINAL CORD STIMULATOR INSERTION N/A 11/24/2015   Procedure: Spinal cord stimulator interregation of leads,  implantable pulse generator replacement;  Surgeon: Clydell Hakim, MD;  Location: [place] [facility];  Service: Neurosurgery;  Laterality: N/A;  Spinal cord stimulator interregation of leads,  implantable pulse generator replacement   TONSILLECTOMY     TOTAL SHOULDER REPLACEMENT Left 04/2016   Social History   Social History Narrative   Not on file  Immunization History  Administered Date(s) Administered   Moderna Covid-19 Vaccine Bivalent Booster 52yr & up 08/15/2021   Moderna SARS-COV2 Booster Vaccination 08/22/2020, 05/17/2021   Moderna Sars-Covid-2 Vaccination 10/26/2019, 11/23/2019     Objective: Vital Signs: BP 136/64 (BP Location: Right Arm, Patient Position: Sitting, Cuff Size: Normal)   Pulse 92   Resp 16   Ht 5' 3.5" (1.613 m)   Wt 125 lb (56.7 kg)   BMI 21.80 kg/m    Physical Exam Vitals and nursing note reviewed.  Constitutional:      Appearance: She is well-developed.  HENT:     Head: Normocephalic and atraumatic.  Eyes:     Conjunctiva/sclera: Conjunctivae normal.  Cardiovascular:     Rate and Rhythm: Normal rate and regular rhythm.     Heart sounds: Normal heart sounds.  Pulmonary:     Effort: Pulmonary effort is normal.     Breath sounds: Normal breath sounds.  Abdominal:     General: Bowel sounds are normal.     Palpations: Abdomen is soft.  Musculoskeletal:     Cervical back: Normal range of motion.  Lymphadenopathy:     Cervical: No cervical adenopathy.  Skin:    General: Skin is warm and dry.     Capillary Refill: Capillary refill takes less than 2 seconds.  Neurological:     Mental Status: She is alert and oriented to person, place, and time.  Psychiatric:        Behavior:  Behavior normal.     Musculoskeletal Exam: C-spine was in good range of motion.  She had some limitation range of motion of her lumbar spine.  Left shoulder joint abduction was limited to 90 degrees with discomfort on internal rotation and abduction.  Right shoulder joint was in full range of motion with minimal discomfort.  Elbow joints and wrist joints with good range of motion.  She had bilateral CMC, PIP and DIP thickening with no synovitis.  She had painful limited range of motion of her left hip joint.  Right hip joint was in full range of motion.  Right knee joint is replaced without any warmth swelling or effusion.  Left knee joint was in good range of motion without any warmth swelling or effusion.  She had no tenderness over ankles or MTPs.  No synovitis was noted.  CDAI Exam: CDAI Score: -- Patient Global: --; Provider Global: -- Swollen: --; Tender: -- Joint Exam 09/26/2021   No joint exam has been documented for this visit   There is currently no information documented on the homunculus. Go to the Rheumatology activity and complete the homunculus joint exam.  Investigation: No additional findings.  Imaging: No results found.  Recent Labs: Lab Results  Component Value Date   WBC 7.7 11/11/2016   HGB 12.3 11/11/2016   PLT 308 11/11/2016   NA 136 11/11/2016   K 4.2 11/11/2016   CL 98 (L) 11/11/2016   CO2 27 11/11/2016   GLUCOSE 77 11/11/2016   BUN 13 11/11/2016   CREATININE 0.84 11/11/2016   CALCIUM 9.4 11/11/2016   GFRAA >60 11/11/2016    Speciality Comments: No specialty comments available.  Procedures:  No procedures performed Allergies: Buspirone, Mirtazapine, Sucralfate, Tapentadol, Gabapentin, Lyrica [pregabalin], Trazodone, Midodrine, and Sertraline   Assessment / Plan:     Visit Diagnoses: Elevated C-reactive protein (CRP) - 05/28/21:ESR 63, CRP 39.4, ANA negative, CK 50  Elevated sed rate -she had elevated sedimentation rate with generalized pain and  discomfort.  She  was diagnosed with polymyalgia rheumatica by Dr. Earnest Conroy.  She has been on prednisone 10 mg p.o. daily for last 1 month.  She has not minimal difficulty getting out of the chair.  She states she has difficulty shampooing her hair but that comes from underlying osteoarthritis and discomfort in her shoulders which is not change even prior to the onset of these new symptoms.  She had no synovitis on examination.  Plan: CBC with Differential/Platelet, COMPLETE METABOLIC PANEL WITH GFR, Sedimentation rate, Serum protein electrophoresis with reflex.  I will discuss lab results at the follow-up visit.  Muscle pain-she was diagnosed with polymyalgia rheumatica by Dr. Earnest Conroy based on her elevated sedimentation rate and generalized muscle pain.  She has noticed improvement in her polymyalgia symptoms although she continues to have pain and discomfort in her left hip, left knee and her left foot which I think is unrelated to her generalized pain.  Primary osteoarthritis of left shoulder -patient had left rotator cuff tear repair several years ago followed by  L reverse TSR.  She has limited range of motion with discomfort.  She is followed by orthopedic surgeon.  She also has some discomfort in her right shoulder joint.  She has not noticed any increased weakness in her upper extremities.  Pain in left hip -she has been having severe pain and discomfort in her left hip joint with episodic increased pain.  She had limited painful range of motion of her left hip joint.  She has been followed by an orthopedic surgeon in [facility].  I advised her to schedule an appointment with the orthopedic surgeon for evaluation.  She declined x-rays because it would be more convenient for her to have x-rays with her orthopedic surgeon.  Plan: Sedimentation rate, Rheumatoid factor, Cyclic citrul peptide antibody, IgG  Chronic pain of left knee-she complains of pain and discomfort in her left knee.  No warmth swelling or effusion  was noted.  Her knee joint can be evaluated by x-rays as well.  Status post total right knee replacement-she had right total knee replacement several years ago which is doing well.  No warmth swelling or effusion was noted.  She had no discomfort in her right knee.  Idiopathic peripheral neuropathy-she complains of intermittent pain and discomfort in her bilateral feet especially her left foot and ankle.  No warmth swelling or effusion was noted.  She has underlying neuropathy.  DDD (degenerative disc disease), cervical-she had discectomy in her 54s.  She denies any cervical pain today.  DDD (degenerative disc disease), lumbar - s/p fusion 2010.  She denies any discomfort today.  Bilateral carpal tunnel syndrome - Status post bilateral carpal tunnel release.  She is asymptomatic now.  Age-related osteoporosis without current pathological fracture-she is on IV Reclast infusions.  Chronic pain syndrome-she goes to pain management and has a pain pump.  Primary insomnia-she gives history of chronic insomnia despite being on medications.  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  She states the fibrosis of the lung and pulmonary hypertension was related to COPD.  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 Smoked for 15 years.  History of gastroesophageal reflux (GERD)  History of hypothyroidism  History of anxiety  History of basal cell carcinoma  Orders: Orders Placed This Encounter  Procedures   CBC with Differential/Platelet   COMPLETE METABOLIC PANEL WITH GFR   Sedimentation rate  Rheumatoid factor   Cyclic citrul peptide antibody, IgG   Serum protein electrophoresis with reflex    No orders of the defined types were placed in this encounter.    Follow-Up Instructions: Return for PMR, OA.   Bo Merino, MD  Note -  This record has been created using Editor, commissioning.  Chart creation errors have been sought, but may not always  have been located. Such creation errors do not reflect on  the standard of medical care.,

## 2021-09-26 ENCOUNTER — Other Ambulatory Visit: Payer: Self-pay

## 2021-09-26 ENCOUNTER — Encounter: Payer: Self-pay | Admitting: Rheumatology

## 2021-09-26 ENCOUNTER — Ambulatory Visit (INDEPENDENT_AMBULATORY_CARE_PROVIDER_SITE_OTHER): Payer: Medicare Other | Admitting: Rheumatology

## 2021-09-26 ENCOUNTER — Telehealth: Payer: Self-pay | Admitting: Rheumatology

## 2021-09-26 VITALS — BP 136/64 | HR 92 | Resp 16 | Ht 63.5 in | Wt 125.0 lb

## 2021-09-26 DIAGNOSIS — R700 Elevated erythrocyte sedimentation rate: Secondary | ICD-10-CM

## 2021-09-26 DIAGNOSIS — F5101 Primary insomnia: Secondary | ICD-10-CM

## 2021-09-26 DIAGNOSIS — J449 Chronic obstructive pulmonary disease, unspecified: Secondary | ICD-10-CM

## 2021-09-26 DIAGNOSIS — G609 Hereditary and idiopathic neuropathy, unspecified: Secondary | ICD-10-CM

## 2021-09-26 DIAGNOSIS — J4489 Other specified chronic obstructive pulmonary disease: Secondary | ICD-10-CM

## 2021-09-26 DIAGNOSIS — G8929 Other chronic pain: Secondary | ICD-10-CM

## 2021-09-26 DIAGNOSIS — J8410 Pulmonary fibrosis, unspecified: Secondary | ICD-10-CM

## 2021-09-26 DIAGNOSIS — Z7901 Long term (current) use of anticoagulants: Secondary | ICD-10-CM

## 2021-09-26 DIAGNOSIS — M25562 Pain in left knee: Secondary | ICD-10-CM

## 2021-09-26 DIAGNOSIS — R7982 Elevated C-reactive protein (CRP): Secondary | ICD-10-CM | POA: Diagnosis not present

## 2021-09-26 DIAGNOSIS — M19011 Primary osteoarthritis, right shoulder: Secondary | ICD-10-CM

## 2021-09-26 DIAGNOSIS — M5030 Other cervical disc degeneration, unspecified cervical region: Secondary | ICD-10-CM

## 2021-09-26 DIAGNOSIS — Z8719 Personal history of other diseases of the digestive system: Secondary | ICD-10-CM

## 2021-09-26 DIAGNOSIS — M5136 Other intervertebral disc degeneration, lumbar region: Secondary | ICD-10-CM

## 2021-09-26 DIAGNOSIS — M5417 Radiculopathy, lumbosacral region: Secondary | ICD-10-CM

## 2021-09-26 DIAGNOSIS — Z8659 Personal history of other mental and behavioral disorders: Secondary | ICD-10-CM

## 2021-09-26 DIAGNOSIS — G5603 Carpal tunnel syndrome, bilateral upper limbs: Secondary | ICD-10-CM

## 2021-09-26 DIAGNOSIS — M810 Age-related osteoporosis without current pathological fracture: Secondary | ICD-10-CM

## 2021-09-26 DIAGNOSIS — G894 Chronic pain syndrome: Secondary | ICD-10-CM

## 2021-09-26 DIAGNOSIS — Z85828 Personal history of other malignant neoplasm of skin: Secondary | ICD-10-CM

## 2021-09-26 DIAGNOSIS — M791 Myalgia, unspecified site: Secondary | ICD-10-CM | POA: Diagnosis not present

## 2021-09-26 DIAGNOSIS — Z8639 Personal history of other endocrine, nutritional and metabolic disease: Secondary | ICD-10-CM

## 2021-09-26 DIAGNOSIS — M25552 Pain in left hip: Secondary | ICD-10-CM

## 2021-09-26 DIAGNOSIS — M19012 Primary osteoarthritis, left shoulder: Secondary | ICD-10-CM

## 2021-09-26 DIAGNOSIS — I480 Paroxysmal atrial fibrillation: Secondary | ICD-10-CM

## 2021-09-26 DIAGNOSIS — Z96651 Presence of right artificial knee joint: Secondary | ICD-10-CM

## 2021-09-26 DIAGNOSIS — I272 Pulmonary hypertension, unspecified: Secondary | ICD-10-CM

## 2021-09-26 DIAGNOSIS — M51369 Other intervertebral disc degeneration, lumbar region without mention of lumbar back pain or lower extremity pain: Secondary | ICD-10-CM

## 2021-09-26 NOTE — Telephone Encounter (Signed)
FYI: Patient would like someone from our office to call her with the lab results once completed. Patient was advised doctor will go over all results at follow up appointment. Patient states she understands she needs to come back for the follow up, but does not want to wait until then for lab results. Patient was advised a message would be sent back in regards to her request.

## 2021-09-27 ENCOUNTER — Telehealth: Payer: Self-pay

## 2021-09-27 NOTE — Telephone Encounter (Signed)
Attempted to contact patient and left message for patient to call the office.  

## 2021-09-27 NOTE — Progress Notes (Signed)
White cell count is mildly elevated due to prednisone use.  CMP is normal.  Sed rate is normal.  Rheumatoid factor is negative.  Anti-CCP is negative.  SPEP is pending.

## 2021-09-27 NOTE — Telephone Encounter (Signed)
Patient called stating Dr. Estanislado Pandy wanted her to decrease her Prednisone from 10 mg to 9 mg and is unable to cut the pill because it is too small.  Patient states she needs "a lot of 1 mg tablets."  Patient states you can leave a message on her answering machine.

## 2021-09-27 NOTE — Telephone Encounter (Signed)
There is nothing new to offer her.  She should discuss future treatment plan with Dr. Earnest Conroy.

## 2021-09-28 LAB — COMPLETE METABOLIC PANEL WITH GFR
AG Ratio: 1.4 (calc) (ref 1.0–2.5)
ALT: 17 U/L (ref 6–29)
AST: 21 U/L (ref 10–35)
Albumin: 4 g/dL (ref 3.6–5.1)
Alkaline phosphatase (APISO): 75 U/L (ref 37–153)
BUN: 15 mg/dL (ref 7–25)
CO2: 33 mmol/L — ABNORMAL HIGH (ref 20–32)
Calcium: 9.8 mg/dL (ref 8.6–10.4)
Chloride: 100 mmol/L (ref 98–110)
Creat: 0.65 mg/dL (ref 0.60–0.95)
Globulin: 2.9 g/dL (calc) (ref 1.9–3.7)
Glucose, Bld: 79 mg/dL (ref 65–99)
Potassium: 3.9 mmol/L (ref 3.5–5.3)
Sodium: 142 mmol/L (ref 135–146)
Total Bilirubin: 0.4 mg/dL (ref 0.2–1.2)
Total Protein: 6.9 g/dL (ref 6.1–8.1)
eGFR: 88 mL/min/{1.73_m2} (ref >=60)

## 2021-09-28 LAB — CBC WITH DIFFERENTIAL/PLATELET
Absolute Monocytes: 506 cells/uL (ref 200–950)
Basophils Absolute: 0 cells/uL (ref 0–200)
Basophils Relative: 0 %
Eosinophils Absolute: 92 cells/uL (ref 15–500)
Eosinophils Relative: 0.8 %
HCT: 37.9 % (ref 35.0–45.0)
Hemoglobin: 12 g/dL (ref 11.7–15.5)
Lymphs Abs: 1300 cells/uL (ref 850–3900)
MCH: 28.2 pg (ref 27.0–33.0)
MCHC: 31.7 g/dL — ABNORMAL LOW (ref 32.0–36.0)
MCV: 89.2 fL (ref 80.0–100.0)
MPV: 10.4 fL (ref 7.5–12.5)
Monocytes Relative: 4.4 %
Neutro Abs: 9603 cells/uL — ABNORMAL HIGH (ref 1500–7800)
Neutrophils Relative %: 83.5 %
Platelets: 292 10*3/uL (ref 140–400)
RBC: 4.25 10*6/uL (ref 3.80–5.10)
RDW: 14.3 % (ref 11.0–15.0)
Total Lymphocyte: 11.3 %
WBC: 11.5 10*3/uL — ABNORMAL HIGH (ref 3.8–10.8)

## 2021-09-28 LAB — PROTEIN ELECTROPHORESIS, SERUM, WITH REFLEX
Albumin ELP: 3.9 g/dL (ref 3.8–4.8)
Alpha 1: 0.4 g/dL — ABNORMAL HIGH (ref 0.2–0.3)
Alpha 2: 0.9 g/dL (ref 0.5–0.9)
Beta 2: 0.4 g/dL (ref 0.2–0.5)
Beta Globulin: 0.5 g/dL (ref 0.4–0.6)
Gamma Globulin: 0.9 g/dL (ref 0.8–1.7)
Total Protein: 6.9 g/dL (ref 6.1–8.1)

## 2021-09-28 LAB — RHEUMATOID FACTOR: Rheumatoid fact SerPl-aCnc: <14 IU/mL (ref <14)

## 2021-09-28 LAB — SEDIMENTATION RATE: Sed Rate: 29 mm/h (ref 0–30)

## 2021-09-28 LAB — CYCLIC CITRUL PEPTIDE ANTIBODY, IGG: Cyclic Citrullin Peptide Ab: <16 UNITS

## 2021-09-28 NOTE — Telephone Encounter (Signed)
Please see lab note for details.

## 2021-09-28 NOTE — Telephone Encounter (Signed)
Patient returned call to the office and advised per Dr. Estanislado Pandy there is nothing new to offer her.  She should discuss future treatment plan with Dr. Earnest Conroy. Patient states she was seeing the PA with [facility]. Patient advised she would need to contact their office and advised that she prefers to see Dr. Earnest Conroy instead. Patient advised she would need to discuss dosing of Prednisone with [facility]. Patient expressed understanding.

## 2021-10-02 ENCOUNTER — Telehealth: Payer: Self-pay

## 2021-10-02 NOTE — Telephone Encounter (Signed)
Patient left a voicemail stating she received a call from "someone" from our office needing her social security number.  Patient requested a return call.

## 2021-10-02 NOTE — Telephone Encounter (Signed)
Patient advised we have not contacted her requesting her social security number. Patient expressed understanding.

## 2021-10-18 ENCOUNTER — Ambulatory Visit: Payer: Medicare Other | Admitting: Rheumatology

## 2022-08-13 HISTORY — PX: ATRIAL FIBRILLATION ABLATION: SHX5732

## 2022-10-28 HISTORY — PX: OTHER SURGICAL HISTORY: SHX169

## 2023-05-29 ENCOUNTER — Other Ambulatory Visit: Payer: Self-pay | Admitting: Neurological Surgery

## 2023-05-30 ENCOUNTER — Other Ambulatory Visit: Payer: Self-pay | Admitting: Neurological Surgery

## 2023-06-17 NOTE — Pre-Procedure Instructions (Signed)
.Surgical Instructions   Your procedure is scheduled on June 27, 2023. Report to [facility] Main Entrance "A" at 10:30 A.M., then check in with the Admitting office. Any questions or running late day of surgery: call [phone]  Questions prior to your surgery date: call [phone], Monday-Friday, 8am-4pm. If you experience any cold or flu symptoms such as cough, fever, chills, shortness of breath, etc. between now and your scheduled surgery, please notify us at the above number.     Remember:  Do not eat or drink after midnight the night before your surgery    Take these medicines the morning of surgery with A SIP OF WATER: fludrocortisone (FLORINEF)  Fluticasone-Umeclidin-Vilant (TRELEGY ELLIPTA) inhaler pantoprazole (PROTONIX)    May take these medicines IF NEEDED: acetaminophen (TYLENOL)  albuterol (PROVENTIL HFA;VENTOLIN HFA) inhaler  oxyCODONE (OXY IR/ROXICODONE)    Follow your surgeon's instructions on when to stop apixaban (ELIQUIS).  If no instructions were given by your surgeon then you will need to call the office to get those instructions.     One week prior to surgery, STOP taking any Aspirin (unless otherwise instructed by your surgeon) Aleve, Naproxen, Ibuprofen, Motrin, Advil, Goody's, BC's, all herbal medications, fish oil, and non-prescription vitamins.                     Do NOT Smoke (Tobacco/Vaping) for 24 hours prior to your procedure.  If you use a CPAP at night, you may bring your mask/headgear for your overnight stay.   You will be asked to remove any contacts, glasses, piercing's, hearing aid's, dentures/partials prior to surgery. Please bring cases for these items if needed.    Patients discharged the day of surgery will not be allowed to drive home, and someone needs to stay with them for 24 hours.  SURGICAL WAITING ROOM VISITATION Patients may have no more than 2 support people in the waiting area - these visitors may rotate.   Pre-op  nurse will coordinate an appropriate time for 1 ADULT support person, who may not rotate, to accompany patient in pre-op.  Children under the age of 1 must have an adult with them who is not the patient and must remain in the main waiting area with an adult.  If the patient needs to stay at the hospital during part of their recovery, the visitor guidelines for inpatient rooms apply.  Please refer to the [facility] website for the visitor guidelines for any additional information.   If you received a COVID test during your pre-op visit  it is requested that you wear a mask when out in public, stay away from anyone that may not be feeling well and notify your surgeon if you develop symptoms. If you have been in contact with anyone that has tested positive in the last 10 days please notify you surgeon.      Pre-operative 5 CHG Bathing Instructions   You can play a key role in reducing the risk of infection after surgery. Your skin needs to be as free of germs as possible. You can reduce the number of germs on your skin by washing with CHG (chlorhexidine gluconate) soap before surgery. CHG is an antiseptic soap that kills germs and continues to kill germs even after washing.   DO NOT use if you have an allergy to chlorhexidine/CHG or antibacterial soaps. If your skin becomes reddened or irritated, stop using the CHG and notify one of our RNs at [phone].   Please shower with  the CHG soap starting 4 days before surgery using the following schedule:     Please keep in mind the following:  DO NOT shave, including legs and underarms, starting the day of your first shower.   You may shave your face at any point before/day of surgery.  Place clean sheets on your bed the day you start using CHG soap. Use a clean washcloth (not used since being washed) for each shower. DO NOT sleep with pets once you start using the CHG.   CHG Shower Instructions:  If you choose to wash your hair and private  area, wash first with your normal shampoo/soap.  After you use shampoo/soap, rinse your hair and body thoroughly to remove shampoo/soap residue.  Turn the water OFF and apply about 3 tablespoons (45 ml) of CHG soap to a CLEAN washcloth.  Apply CHG soap ONLY FROM YOUR NECK DOWN TO YOUR TOES (washing for 3-5 minutes)  DO NOT use CHG soap on face, private areas, open wounds, or sores.  Pay special attention to the area where your surgery is being performed.  If you are having back surgery, having someone wash your back for you may be helpful. Wait 2 minutes after CHG soap is applied, then you may rinse off the CHG soap.  Pat dry with a clean towel  Put on clean clothes/pajamas   If you choose to wear lotion, please use ONLY the CHG-compatible lotions on the back of this paper.   Additional instructions for the day of surgery: DO NOT APPLY any lotions, deodorants, cologne, or perfumes.   Do not bring valuables to the hospital. [facility] is not responsible for any belongings/valuables. Do not wear nail polish, gel polish, artificial nails, or any other type of covering on natural nails (fingers and toes) Do not wear jewelry or makeup Put on clean/comfortable clothes.  Please brush your teeth.  Ask your nurse before applying any prescription medications to the skin.     CHG Compatible Lotions   Aveeno Moisturizing lotion  Cetaphil Moisturizing Cream  Cetaphil Moisturizing Lotion  Clairol Herbal Essence Moisturizing Lotion, Dry Skin  Clairol Herbal Essence Moisturizing Lotion, Extra Dry Skin  Clairol Herbal Essence Moisturizing Lotion, Normal Skin  Curel Age Defying Therapeutic Moisturizing Lotion with Alpha Hydroxy  Curel Extreme Care Body Lotion  Curel Soothing Hands Moisturizing Hand Lotion  Curel Therapeutic Moisturizing Cream, Fragrance-Free  Curel Therapeutic Moisturizing Lotion, Fragrance-Free  Curel Therapeutic Moisturizing Lotion, Original Formula  Eucerin Daily Replenishing  Lotion  Eucerin Dry Skin Therapy Plus Alpha Hydroxy Crme  Eucerin Dry Skin Therapy Plus Alpha Hydroxy Lotion  Eucerin Original Crme  Eucerin Original Lotion  Eucerin Plus Crme Eucerin Plus Lotion  Eucerin TriLipid Replenishing Lotion  Keri Anti-Bacterial Hand Lotion  Keri Deep Conditioning Original Lotion Dry Skin Formula Softly Scented  Keri Deep Conditioning Original Lotion, Fragrance Free Sensitive Skin Formula  Keri Lotion Fast Absorbing Fragrance Free Sensitive Skin Formula  Keri Lotion Fast Absorbing Softly Scented Dry Skin Formula  Keri Original Lotion  Keri Skin Renewal Lotion Keri Silky Smooth Lotion  Keri Silky Smooth Sensitive Skin Lotion  Nivea Body Creamy Conditioning Oil  Nivea Body Extra Enriched Lotion  Nivea Body Original Lotion  Nivea Body Sheer Moisturizing Lotion Nivea Crme  Nivea Skin Firming Lotion  NutraDerm 30 Skin Lotion  NutraDerm Skin Lotion  NutraDerm Therapeutic Skin Cream  NutraDerm Therapeutic Skin Lotion  ProShield Protective Hand Cream  Provon moisturizing lotion  Please read over the following fact sheets that  you were given.

## 2023-06-18 ENCOUNTER — Other Ambulatory Visit: Payer: Self-pay

## 2023-06-18 ENCOUNTER — Encounter (HOSPITAL_COMMUNITY): Payer: Self-pay | Admitting: Neurological Surgery

## 2023-06-18 ENCOUNTER — Encounter (HOSPITAL_COMMUNITY): Payer: Self-pay

## 2023-06-18 ENCOUNTER — Encounter (HOSPITAL_COMMUNITY)
Admission: RE | Admit: 2023-06-18 | Discharge: 2023-06-18 | Disposition: A | Discharge status: Home / Self Care | Payer: Medicare Other | Source: Ambulatory Visit | Attending: Neurological Surgery | Admitting: Neurological Surgery

## 2023-06-18 VITALS — BP 112/56 | HR 75 | Temp 97.5°F | Resp 18 | Ht 65.0 in | Wt 104.6 lb

## 2023-06-18 DIAGNOSIS — G894 Chronic pain syndrome: Secondary | ICD-10-CM | POA: Insufficient documentation

## 2023-06-18 DIAGNOSIS — M353 Polymyalgia rheumatica: Secondary | ICD-10-CM | POA: Insufficient documentation

## 2023-06-18 DIAGNOSIS — Z01818 Encounter for other preprocedural examination: Secondary | ICD-10-CM

## 2023-06-18 DIAGNOSIS — E039 Hypothyroidism, unspecified: Secondary | ICD-10-CM | POA: Insufficient documentation

## 2023-06-18 DIAGNOSIS — Z451 Encounter for adjustment and management of infusion pump: Secondary | ICD-10-CM | POA: Insufficient documentation

## 2023-06-18 DIAGNOSIS — Z87891 Personal history of nicotine dependence: Secondary | ICD-10-CM | POA: Diagnosis not present

## 2023-06-18 DIAGNOSIS — Z01810 Encounter for preprocedural cardiovascular examination: Secondary | ICD-10-CM | POA: Insufficient documentation

## 2023-06-18 DIAGNOSIS — G4733 Obstructive sleep apnea (adult) (pediatric): Secondary | ICD-10-CM | POA: Insufficient documentation

## 2023-06-18 DIAGNOSIS — J45909 Unspecified asthma, uncomplicated: Secondary | ICD-10-CM | POA: Insufficient documentation

## 2023-06-18 DIAGNOSIS — K219 Gastro-esophageal reflux disease without esophagitis: Secondary | ICD-10-CM | POA: Diagnosis not present

## 2023-06-18 DIAGNOSIS — J449 Chronic obstructive pulmonary disease, unspecified: Secondary | ICD-10-CM | POA: Insufficient documentation

## 2023-06-18 DIAGNOSIS — I272 Pulmonary hypertension, unspecified: Secondary | ICD-10-CM | POA: Diagnosis not present

## 2023-06-18 LAB — CBC
HCT: 33.9 % — ABNORMAL LOW (ref 36.0–46.0)
Hemoglobin: 10.6 g/dL — ABNORMAL LOW (ref 12.0–15.0)
MCH: 28.2 pg (ref 26.0–34.0)
MCHC: 31.3 g/dL (ref 30.0–36.0)
MCV: 90.2 fL (ref 80.0–100.0)
Platelets: 292 10*3/uL (ref 150–400)
RBC: 3.76 MIL/uL — ABNORMAL LOW (ref 3.87–5.11)
RDW: 13.9 % (ref 11.5–15.5)
WBC: 8.3 10*3/uL (ref 4.0–10.5)
nRBC: 0 % (ref 0.0–0.2)

## 2023-06-18 LAB — BASIC METABOLIC PANEL
Anion gap: 9 (ref 5–15)
BUN: 12 mg/dL (ref 8–23)
CO2: 27 mmol/L (ref 22–32)
Calcium: 9.1 mg/dL (ref 8.9–10.3)
Chloride: 100 mmol/L (ref 98–111)
Creatinine, Ser: 0.69 mg/dL (ref 0.44–1.00)
GFR, Estimated: >60 mL/min (ref >60)
Glucose, Bld: 113 mg/dL — ABNORMAL HIGH (ref 70–99)
Potassium: 4.2 mmol/L (ref 3.5–5.1)
Sodium: 136 mmol/L (ref 135–145)

## 2023-06-18 LAB — SURGICAL PCR SCREEN
MRSA, PCR: NEGATIVE
Staphylococcus aureus: NEGATIVE

## 2023-06-18 NOTE — Progress Notes (Signed)
PCP - Rushie Nyhan Cardiologist - Clydie Braun, MD Pulmonologist - Geronimo Running Ejaz,MD  PPM/ICD - denies Device Orders -  Rep Notified -   Chest x-ray - na EKG - 04/11/23- tracing requested Stress Test - 02/26/23 ECHO - 02/25/23 Cardiac Cath - none  Sleep Study - yes CPAP - yes  Fasting Blood Sugar - na Checks Blood Sugar _____ times a day  Last dose of GLP1 agonist-  na GLP1 instructions:   Blood Thinner Instructions: stop Eliquis 3 days prior to surgery per surgeon- last dose will be 8/26 Aspirin Instructions:na  ERAS Protcol -no PRE-SURGERY Ensure or G2-   COVID TEST- na   Anesthesia review: yes- cardiac clearance -see note  CE 06/04/23.Pt has history of afib  Patient denies shortness of breath, fever, cough and chest pain at PAT appointment   All instructions explained to the patient, with a verbal understanding of the material. Patient agrees to go over the instructions while at home for a better understanding. Patient also instructed to wear a mask when out in public prior to surgery. The opportunity to ask questions was provided.

## 2023-06-19 NOTE — Anesthesia Preprocedure Evaluation (Addendum)
Anesthesia Evaluation  Patient identified by MRN, date of birth, ID band Patient awake    Reviewed: Allergy & Precautions, H&P , NPO status , Patient's Chart, lab work & pertinent test results  History of Anesthesia Complications (+) PONV and history of anesthetic complications  Airway Mallampati: II  TM Distance: >3 FB Neck ROM: Full    Dental no notable dental hx.    Pulmonary shortness of breath, asthma , sleep apnea , pneumonia, COPD, former smoker   Pulmonary exam normal breath sounds clear to auscultation       Cardiovascular Normal cardiovascular exam+ dysrhythmias  Rhythm:Regular Rate:Normal     Neuro/Psych  PSYCHIATRIC DISORDERS  Depression     Neuromuscular disease    GI/Hepatic Neg liver ROS,GERD  ,,  Endo/Other  Hypothyroidism    Renal/GU negative Renal ROS  negative genitourinary   Musculoskeletal  (+) Arthritis ,    Abdominal   Peds negative pediatric ROS (+)  Hematology  (+) Blood dyscrasia, anemia   Anesthesia Other Findings   Reproductive/Obstetrics negative OB ROS                              Anesthesia Physical Anesthesia Plan  ASA: 3  Anesthesia Plan: General   Post-op Pain Management:    Induction: Intravenous  PONV Risk Score and Plan: TIVA, Ondansetron and Dexamethasone  Airway Management Planned: Oral ETT  Additional Equipment:   Intra-op Plan:   Post-operative Plan: Extubation in OR  Informed Consent: I have reviewed the patients History and Physical, chart, labs and discussed the procedure including the risks, benefits and alternatives for the proposed anesthesia with the patient or authorized representative who has indicated his/her understanding and acceptance.     Dental advisory given  Plan Discussed with: CRNA  Anesthesia Plan Comments: (PAT note written 06/19/2023 by Shonna Chock, PA-C.  History includes former smoker (quit  09/15/75), post-operative N/V, COPD, dyspnea, asthma, pneumonia, OSA ("Moderate sleep apnea AHI of 15.7 on BiPAP 14/10"), pulmonary hypertension (RVSP down to 27.4 mmHg 01/2023 echo), afib/flutter (s/p RF ablation 08/13/22; s/p DCCV 02/27/23), childhood murmur, hypothyroidism, polymyalgia rheumatica, anemia, neuropathy, GERD, depression, breast cancer (s/p bilateral mastectomies ~ 2015), osteoarthritis (right THA ~ 2005 with revision 11/19/22; right TKA 05/08/15, left TSA 05/15/16;  left THA 12/24/21), spinal surgery (spinal cord stimulator 11/24/15), chronic pain syndrome (intrathecal pain pump, last replacement 11/15/16). Records in Care Everywhere indicate that she is on Florinef for postural hypotension. )        Anesthesia Quick Evaluation

## 2023-06-19 NOTE — Progress Notes (Signed)
Anesthesia Chart Review:  Case: [id number] Date/Time: 06/27/23 1215   Procedure: INTRATHECAL PAIN PUMP DEVICE CHANGE   Anesthesia type: General   Pre-op diagnosis: PRESENCE OF INTRATHECAL PUMP   [facility] ROOM [facility] / [facility]   Surgeons: DawleyAlan Mulder, DO       DISCUSSION:  Patient is an 82 year old female scheduled for the above procedure. She has a Morphine pain pump.  History includes former smoker (quit 09/15/75), post-operative N/V, COPD, dyspnea, asthma, pneumonia, OSA ("Moderate sleep apnea AHI of 15.7 on BiPAP 14/10"), pulmonary hypertension (RVSP down to 27.4 mmHg 01/2023 echo), afib/flutter (s/p RF ablation 08/13/22; s/p DCCV 02/27/23), childhood murmur, hypothyroidism, polymyalgia rheumatica, anemia, neuropathy, GERD, depression, breast cancer (s/p bilateral mastectomies ~ 2015), osteoarthritis (right THA ~ 2005 with revision 11/19/22; right TKA 05/08/15, left TSA 05/15/16;  left THA 12/24/21), spinal surgery (spinal cord stimulator 11/24/15), chronic pain syndrome (intrathecal pain pump, last replacement 11/15/16). Records in Care Everywhere indicate that she is on Florinef for postural hypotension.  Last pulmonology visit was on 05/13/23 by Dr. Su Monks. She is followed for COPD, pulmonary hypertension with underlying PAF and OSA, waxing and waning pulmonary nodules (suggesting infectious or inflammatory process). Did not use BiPAP while on vacation, but otherwise reported compliance. PAH felt to be multifactorial with symptoms better after DCCV. Pulmonary nodules and reactive adenopathy felt stable on last imaging. Continue Trelegy Ellipta and BiPAP. She was felt to be maximized from a pulmonary standpoint. Six month follow-up planned.   Last cardiology visit was on 04/10/13 with Jari Favre, NP. She denied any new cardiac symptoms. She had a non-ischemic nuclear stress test 02/26/23. 02/25/23 echo showed LVEF 55-60%, no regional wall motion abnormalities, moderate TR, RVSP 27.4 mmHg. Since her 02/27/23  DCCV. Feeling better since Multaq stopped due to side effects. EKG showed NSR. She is on diltiazem and Toprol, but otherwise not on antiarrhythmic therapy. (Not felt to be a good candidate for amiodarone due to lung disease.) If she develops recurrent aflutter then best option may be to consider another ablation procedure. Continue Eliquis. 3-7 day Holter monitor ordered to assess for any recurrent arrhythmias and showed 5 episodes of aflutter lasting 47 seconds to just under 2 hours and 52 short runs of atrial tachycardia.  Four month follow-up planned. As of 06/04/23, NP added, "It is reasonable and low risk to hold her anticoagulation for her neurosurgery procedure. May hold eliquis for 48hr prior to procedure." She reported instructions to hold Eliquis 3 days prior to surgery.  Anesthesia team to evaluate on the day of surgery.   VS: BP (!) 112/56   Pulse 75   Temp (!) 36.4 C   Resp 18   Ht 5\' 5"  (1.651 m)   Wt 47.4 kg   SpO2 100%   BMI 17.41 kg/m    PROVIDERS: Zoila Shutter, MD is PCP  Clydie Braun, MD is EP cardiologist ([facility]) Ralph Leyden, MD is pulmonologist ([facility]).  Cecille Aver, MD is ENT ([facility]). 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 He offered referral to SLP for voice rehab, but she deferred.   LABS: Labs reviewed: Acceptable for surgery. (all labs ordered are listed, but only abnormal results are displayed)  Labs Reviewed  BASIC METABOLIC PANEL - Abnormal; Notable for the following components:      Result Value   Glucose, Bld 113 (*)    All other components within normal  limits  CBC - Abnormal; Notable for the following components:   RBC 3.76 (*)    Hemoglobin 10.6 (*)    HCT 33.9 (*)    All other components within normal limits  SURGICAL PCR SCREEN    OTHER:  Flexible fiberoptic laryngoscopy 06/17/23 ([facility]): Indications:  hoarseness, dysphagia, or aspiration   - Nasal cavity: Right nasal cavity not occluded, left nasal cavity not occluded, no right nasal cavity polyps and no left nasal cavity polyps   - RIGHT and LEFT inferior turbinates: Normal  - Septum: Normal - Sinus:  RIGHT and LEFT Eustachian tube orifices: Normal  - Mouth:  Posterior pharyngeal wall: Normal   Oropharynx: Normal   Vallecula: Normal   Base of tongue: Normal   Epiglottis: Normal   - Throat:  RIGHT and LEFT hypopharynx: Normal. No lesion and no inflammation   Pyriform sinus: Normal   False vocal cords: Normal. 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 No reduced mobility, no lesion and no inflammation   - Comment:  TVCs thin and atrophied (worse on the left) with a glottic  gap on phonation.   PFTs 12/06/20: FVC 1.80 (68%). FEV1 1.40 (71%). FEV1R 0.78 (105%). Mild Restriction.    IMAGES: CTA Chest 02/19/23 ([facility]): IMPRESSION: 1. Negative examination for pulmonary embolism.  2. Numerous clustered centrilobular and tree-in-bud nodules  throughout the lungs, generally similar to prior examination,  assessment on prior examination limited. Findings are consistent  with atypical infection, particularly atypical mycobacterial  infection.  3. Unchanged prominent mediastinal and hilar lymph nodes,  nonspecific, possibly reactive.  Aortic Atherosclerosis (ICD10-I70.0).   CTA Head/Neck 01/03/21 ([facility]): IMPRESSION:  - No acute intracranial hemorrhage or definite acute infarction.  Age-indeterminate small vessel infarct of the anterior left internal  capsule.  - No large vessel occlusion. Plaque at the ICA origins without  hemodynamically significant stenosis. No proximal intracranial  vessel occlusion.  - Small centrilobular nodularity in the visualized upper lungs.  Similar findings are present on prior study suggesting a  waxing/waning infectious or inflammatory process.     EKG: EKG 04/11/23 ([facility]): Sinus rhythm  Normal ECG  When compared with ECG of 14-Mar-2023 09 31,  Sinus rhythm has replaced Atrial fibrillation  Right bundle branch block is no longer present  Confirmed by Seward Speck  [phone]  on 04-11-2023 11 18 39 AM    CV: Holter Monitor 04/16/23 ([facility]): PRELIMINARY FINDINGS  Patient had a min HR of 60 bpm, max HR of 167 bpm, and avg HR of 80 bpm. Predominant underlying rhythm was Sinus Rhythm. 52 Supraventricular Tachycardia runs occurred, the run with the fastest interval lasting 11 beats with a max rate of 167 bpm, the longest lasting 18 beats with an avg rate of 117 bpm. Atrial Flutter occurred  1% burden , ranging from 104-150 bpm  avg of 128 bpm , the longest lasting 1 hour 53 mins with an avg rate of 128 bpm. Isolated SVEs were rare  <1.0% , SVE Couplets were rare  <1.0% , and SVE Triplets were rare  <1.0% . Isolated VEs were rare  <1.0%, 1049 , VE Couplets were rare  <1.0%, 32 , and VE Triplets were rare  <1.0%, 3 . Ventricular Trigeminy was present.  FINAL Sampson Goon, Onalee Hua, MD): Agree with Findings  - 5 episodes of atrial flutter lasting 47 sec-1 hr 53 min with average rate 128 bpm  - 52 episodes of atrial tachycardia lasting  4-18 beats.    NM Cardiac SPECT Stress Test 02/26/23 ([facility]): 1. No reversible ischemia or infarction.  2. Normal left ventricular wall motion.  3. Left ventricular ejection fraction 57%  4. Non invasive risk stratification*: Low  *2012 Appropriate Use Criteria for Coronary Revascularization  Focused Update: J Am Coll Cardiol. 2012;59(9):857-881.  http://content.dementiazones.com.aspx?articleid=[email or web address]    Echo 02/25/23 ([facility]): SUMMARY  The left ventricular size is normal.  There is mild concentric left ventricular hypertrophy.  Left ventricular systolic function is normal.  LV ejection fraction = 55-60%.  No segmental wall motion abnormalities seen in the left ventricle  The right ventricle  is normal size.  The right ventricular systolic function is borderline reduced.  The right atrium is mildly dilated.  There is moderate tricuspid regurgitation.  No pulmonary hypertension.  The aortic sinus is normal size.  IVC size was normal.  There is no pericardial effusion.  Compared to prior study, RVSP has decreased, RVSP 27.4 mmHg.    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)    Pneumonia    Polymyalgia rheumatica (HCC)    PONV (postoperative nausea and vomiting)    in past, not in past 2  years   Shortness of breath dyspnea    Sleep apnea     Past Surgical History:  Procedure Laterality Date   ATRIAL FIBRILLATION ABLATION  08/13/2022   BACK SURGERY     fusion L1 - S 2   BREAST SURGERY     COLONOSCOPY     EYE SURGERY Bilateral    cataract   JOINT REPLACEMENT     right knee and right hip   left hip replacement     MASTECTOMY Bilateral    1989 -left(?) and 2009-right(?)   PAIN PUMP REVISION N/A 11/15/2016   Procedure: Intrathecal pump replacement;  Surgeon: Odette Fraction, MD;  [facility]: [facility];  Service: Neurosurgery;  Laterality: N/A;  Intrathecal pump replacement   pudendal nerve     right hip revision  10/2022   SPINAL CORD STIMULATOR INSERTION N/A 11/24/2015   Procedure: Spinal cord stimulator interregation of leads,  implantable pulse generator replacement;  Surgeon: Odette Fraction, MD;  Location: [facility];  Service: Neurosurgery;  Laterality: N/A;  Spinal cord stimulator interregation of leads,   implantable pulse generator replacement   TONSILLECTOMY     TOTAL SHOULDER REPLACEMENT Left 04/2016    MEDICATIONS:  acetaminophen (TYLENOL) 500 MG tablet   albuterol (PROVENTIL HFA;VENTOLIN HFA) 108 (90 Base) MCG/ACT inhaler   apixaban (ELIQUIS) 2.5 MG TABS tablet   diltiazem (TIAZAC) 120 MG 24 hr capsule   fludrocortisone (FLORINEF) 0.1 MG tablet   Fluticasone-Umeclidin-Vilant (TRELEGY ELLIPTA) 200-62.5-25 MCG/ACT AEPB   levothyroxine (SYNTHROID, LEVOTHROID) 75 MCG tablet   metoprolol succinate (TOPROL-XL) 25 MG 24 hr tablet   montelukast (SINGULAIR) 10 MG tablet   Multiple Vitamins-Minerals (PRESERVISION AREDS PO)   NONFORMULARY OR COMPOUNDED ITEM  oxyCODONE (OXY IR/ROXICODONE) 5 MG immediate release tablet   pantoprazole (PROTONIX) 40 MG tablet   No current facility-administered medications for this encounter.    Shonna Chock, PA-C Surgical Short Stay/Anesthesiology [facility] Phone [phone] [facility] Phone [phone] 06/19/2023 4:47 PM

## 2023-06-27 ENCOUNTER — Encounter (HOSPITAL_COMMUNITY)
Admission: RE | Disposition: A | Discharge status: Home / Self Care | Payer: Self-pay | Source: Home / Self Care | Attending: Neurological Surgery

## 2023-06-27 ENCOUNTER — Ambulatory Visit (HOSPITAL_COMMUNITY)
Admission: RE | Admit: 2023-06-27 | Discharge: 2023-06-27 | Disposition: A | Discharge status: Home / Self Care | Payer: Medicare Other | Attending: Neurological Surgery | Admitting: Neurological Surgery

## 2023-06-27 ENCOUNTER — Encounter (HOSPITAL_COMMUNITY): Payer: Self-pay | Admitting: Neurological Surgery

## 2023-06-27 ENCOUNTER — Other Ambulatory Visit: Payer: Self-pay

## 2023-06-27 ENCOUNTER — Ambulatory Visit (HOSPITAL_COMMUNITY): Payer: Medicare Other | Admitting: Vascular Surgery

## 2023-06-27 ENCOUNTER — Ambulatory Visit (HOSPITAL_BASED_OUTPATIENT_CLINIC_OR_DEPARTMENT_OTHER): Payer: Medicare Other | Admitting: Certified Registered"

## 2023-06-27 ENCOUNTER — Ambulatory Visit (HOSPITAL_COMMUNITY): Payer: Medicare Other

## 2023-06-27 DIAGNOSIS — M353 Polymyalgia rheumatica: Secondary | ICD-10-CM | POA: Diagnosis not present

## 2023-06-27 DIAGNOSIS — G4730 Sleep apnea, unspecified: Secondary | ICD-10-CM | POA: Insufficient documentation

## 2023-06-27 DIAGNOSIS — I4891 Unspecified atrial fibrillation: Secondary | ICD-10-CM | POA: Insufficient documentation

## 2023-06-27 DIAGNOSIS — Z87891 Personal history of nicotine dependence: Secondary | ICD-10-CM | POA: Insufficient documentation

## 2023-06-27 DIAGNOSIS — Z7901 Long term (current) use of anticoagulants: Secondary | ICD-10-CM | POA: Diagnosis not present

## 2023-06-27 DIAGNOSIS — J449 Chronic obstructive pulmonary disease, unspecified: Secondary | ICD-10-CM | POA: Diagnosis not present

## 2023-06-27 DIAGNOSIS — M1990 Unspecified osteoarthritis, unspecified site: Secondary | ICD-10-CM | POA: Diagnosis not present

## 2023-06-27 DIAGNOSIS — E039 Hypothyroidism, unspecified: Secondary | ICD-10-CM | POA: Insufficient documentation

## 2023-06-27 DIAGNOSIS — R0602 Shortness of breath: Secondary | ICD-10-CM | POA: Insufficient documentation

## 2023-06-27 DIAGNOSIS — Z978 Presence of other specified devices: Secondary | ICD-10-CM

## 2023-06-27 DIAGNOSIS — I2721 Secondary pulmonary arterial hypertension: Secondary | ICD-10-CM | POA: Diagnosis not present

## 2023-06-27 DIAGNOSIS — D649 Anemia, unspecified: Secondary | ICD-10-CM | POA: Insufficient documentation

## 2023-06-27 DIAGNOSIS — Z4549 Encounter for adjustment and management of other implanted nervous system device: Secondary | ICD-10-CM | POA: Diagnosis present

## 2023-06-27 DIAGNOSIS — K219 Gastro-esophageal reflux disease without esophagitis: Secondary | ICD-10-CM | POA: Insufficient documentation

## 2023-06-27 HISTORY — PX: PAIN PUMP IMPLANTATION: SHX330

## 2023-06-27 HISTORY — DX: Anemia, unspecified: D64.9

## 2023-06-27 HISTORY — DX: Sleep apnea, unspecified: G47.30

## 2023-06-27 SURGERY — PAIN PUMP INSERTION
Anesthesia: General

## 2023-06-27 MED ORDER — MORPHINE SULFATE (PF) 10 MG/ML IV SOLN
INTRAVENOUS | Status: DC | PRN
Start: 2023-06-27 — End: 2023-06-27
  Administered 2023-06-27: 2 mL via INTRAMUSCULAR

## 2023-06-27 MED ORDER — CHLORHEXIDINE GLUCONATE CLOTH 2 % EX PADS: 6.0000 | MEDICATED_PAD | Freq: Once | CUTANEOUS | Status: DC

## 2023-06-27 MED ORDER — BUPIVACAINE-EPINEPHRINE (PF) 0.5% -1:200000 IJ SOLN
INTRAMUSCULAR | Status: DC | PRN
  Administered 2023-06-27: 10 mL

## 2023-06-27 MED ORDER — LIDOCAINE 2% (20 MG/ML) 5 ML SYRINGE
INTRAMUSCULAR | Status: DC | PRN
  Administered 2023-06-27: 50 mg via INTRAVENOUS

## 2023-06-27 MED ORDER — BUPIVACAINE-EPINEPHRINE (PF) 0.5% -1:200000 IJ SOLN
INTRAMUSCULAR | Status: AC
  Filled 2023-06-27: qty 30

## 2023-06-27 MED ORDER — ONDANSETRON HCL 4 MG/2ML IJ SOLN
INTRAMUSCULAR | Status: AC
  Filled 2023-06-27: qty 2

## 2023-06-27 MED ORDER — VANCOMYCIN HCL 1000 MG IV SOLR
INTRAVENOUS | Status: AC
  Filled 2023-06-27: qty 20

## 2023-06-27 MED ORDER — DEXAMETHASONE SODIUM PHOSPHATE 10 MG/ML IJ SOLN
INTRAMUSCULAR | Status: DC | PRN
  Administered 2023-06-27: 10 mg via INTRAVENOUS

## 2023-06-27 MED ORDER — CHLORHEXIDINE GLUCONATE 0.12 % MT SOLN
15.0000 mL | Freq: Once | OROMUCOSAL | Status: AC
  Administered 2023-06-27: 15 mL via OROMUCOSAL
  Filled 2023-06-27: qty 15

## 2023-06-27 MED ORDER — LIDOCAINE 2% (20 MG/ML) 5 ML SYRINGE
INTRAMUSCULAR | Status: AC
  Filled 2023-06-27: qty 5

## 2023-06-27 MED ORDER — DEXAMETHASONE SODIUM PHOSPHATE 10 MG/ML IJ SOLN
INTRAMUSCULAR | Status: AC
  Filled 2023-06-27: qty 1

## 2023-06-27 MED ORDER — LACTATED RINGERS IV SOLN: INTRAVENOUS | Status: DC

## 2023-06-27 MED ORDER — ONDANSETRON HCL 4 MG/2ML IJ SOLN
INTRAMUSCULAR | Status: DC | PRN
  Administered 2023-06-27: 4 mg via INTRAVENOUS

## 2023-06-27 MED ORDER — SUGAMMADEX SODIUM 200 MG/2ML IV SOLN
INTRAVENOUS | Status: DC | PRN
  Administered 2023-06-27: 200 mg via INTRAVENOUS

## 2023-06-27 MED ORDER — LABETALOL HCL 5 MG/ML IV SOLN
INTRAVENOUS | Status: AC
  Filled 2023-06-27: qty 4

## 2023-06-27 MED ORDER — FENTANYL CITRATE (PF) 100 MCG/2ML IJ SOLN: 25.0000 ug | INTRAMUSCULAR | Status: DC | PRN

## 2023-06-27 MED ORDER — DROPERIDOL 2.5 MG/ML IJ SOLN: 0.6250 mg | Freq: Once | INTRAMUSCULAR | Status: DC | PRN

## 2023-06-27 MED ORDER — ROCURONIUM BROMIDE 10 MG/ML (PF) SYRINGE
PREFILLED_SYRINGE | INTRAVENOUS | Status: DC | PRN
  Administered 2023-06-27: 10 mg via INTRAVENOUS
  Administered 2023-06-27: 40 mg via INTRAVENOUS

## 2023-06-27 MED ORDER — LABETALOL HCL 5 MG/ML IV SOLN
10.0000 mg | Freq: Once | INTRAVENOUS | Status: AC
  Administered 2023-06-27: 10 mg via INTRAVENOUS

## 2023-06-27 MED ORDER — ORAL CARE MOUTH RINSE: 15.0000 mL | Freq: Once | OROMUCOSAL | Status: AC

## 2023-06-27 MED ORDER — VANCOMYCIN HCL 1000 MG IV SOLR
INTRAVENOUS | Status: DC | PRN
  Administered 2023-06-27: 1000 mg

## 2023-06-27 MED ORDER — FENTANYL CITRATE (PF) 250 MCG/5ML IJ SOLN
INTRAMUSCULAR | Status: AC
  Filled 2023-06-27: qty 5

## 2023-06-27 MED ORDER — FENTANYL CITRATE (PF) 250 MCG/5ML IJ SOLN
INTRAMUSCULAR | Status: DC | PRN
  Administered 2023-06-27: 100 ug via INTRAVENOUS
  Administered 2023-06-27: 50 ug via INTRAVENOUS

## 2023-06-27 MED ORDER — LIDOCAINE-EPINEPHRINE 1 %-1:100000 IJ SOLN
INTRAMUSCULAR | Status: AC
  Filled 2023-06-27: qty 1

## 2023-06-27 MED ORDER — PROPOFOL 10 MG/ML IV BOLUS
INTRAVENOUS | Status: DC | PRN
  Administered 2023-06-27: 100 ug/kg/min via INTRAVENOUS
  Administered 2023-06-27: 50 mg via INTRAVENOUS
  Administered 2023-06-27: 30 mg via INTRAVENOUS

## 2023-06-27 MED ORDER — CEFAZOLIN SODIUM-DEXTROSE 2-4 GM/100ML-% IV SOLN
2.0000 g | INTRAVENOUS | Status: AC
  Administered 2023-06-27: 2 g via INTRAVENOUS
  Filled 2023-06-27: qty 100

## 2023-06-27 MED ORDER — ROCURONIUM BROMIDE 10 MG/ML (PF) SYRINGE
PREFILLED_SYRINGE | INTRAVENOUS | Status: AC
  Filled 2023-06-27: qty 10

## 2023-06-27 MED ORDER — THROMBIN 5000 UNITS EX SOLR
CUTANEOUS | Status: AC
  Filled 2023-06-27: qty 10000

## 2023-06-27 SURGICAL SUPPLY — 59 items
ADH SKN CLS APL DERMABOND .7 (GAUZE/BANDAGES/DRESSINGS) ×1
BAG COUNTER SPONGE SURGICOUNT (BAG) ×2 IMPLANT
BAG SPNG CNTER NS LX DISP (BAG) ×1
BLADE CLIPPER SURG (BLADE) ×2 IMPLANT
BOOT SUTURE VASCULAR YLW (MISCELLANEOUS) ×1
CABLE BIPOLOR RESECTION CORD (MISCELLANEOUS) ×2 IMPLANT
CANISTER SUCT 3000ML PPV (MISCELLANEOUS) ×2 IMPLANT
CLAMP SUTURE YELLOW 5 PAIRS (MISCELLANEOUS) ×2 IMPLANT
COVER MAYO STAND STRL (DRAPES) ×2 IMPLANT
DERMABOND ADVANCED .7 DNX12 (GAUZE/BANDAGES/DRESSINGS) ×2 IMPLANT
DRAPE C-ARM 42X72 X-RAY (DRAPES) ×2 IMPLANT
DRAPE LAPAROTOMY 100X72X124 (DRAPES) ×2 IMPLANT
DRSG OPSITE POSTOP 3X4 (GAUZE/BANDAGES/DRESSINGS) IMPLANT
DRSG OPSITE POSTOP 4X6 (GAUZE/BANDAGES/DRESSINGS) IMPLANT
DRSG OPSITE POSTOP 4X8 (GAUZE/BANDAGES/DRESSINGS) IMPLANT
ELECT BLADE INSULATED 4IN (ELECTROSURGICAL) ×1
ELECT COATED BLADE 2.86 ST (ELECTRODE) ×2 IMPLANT
ELECT REM PT RETURN 9FT ADLT (ELECTROSURGICAL) ×1
ELECTRODE BLADE INSULATED 4IN (ELECTROSURGICAL) ×2 IMPLANT
ELECTRODE REM PT RTRN 9FT ADLT (ELECTROSURGICAL) ×2 IMPLANT
GAUZE 4X4 16PLY ~~LOC~~+RFID DBL (SPONGE) ×2 IMPLANT
GAUZE PAD ABD 8X10 STRL (GAUZE/BANDAGES/DRESSINGS) IMPLANT
GAUZE SPONGE 4X4 12PLY STRL (GAUZE/BANDAGES/DRESSINGS) IMPLANT
GLOVE BIO SURGEON STRL SZ7 (GLOVE) ×2 IMPLANT
GLOVE BIOGEL PI IND STRL 7.5 (GLOVE) ×2 IMPLANT
GLOVE BIOGEL PI IND STRL 8 (GLOVE) ×2 IMPLANT
GLOVE ECLIPSE 8.0 STRL XLNG CF (GLOVE) ×2 IMPLANT
GLOVE EXAM NITRILE XL STR (GLOVE) IMPLANT
GOWN STRL REUS W/ TWL LRG LVL3 (GOWN DISPOSABLE) IMPLANT
GOWN STRL REUS W/ TWL XL LVL3 (GOWN DISPOSABLE) ×2 IMPLANT
GOWN STRL REUS W/TWL 2XL LVL3 (GOWN DISPOSABLE) IMPLANT
GOWN STRL REUS W/TWL LRG LVL3 (GOWN DISPOSABLE)
GOWN STRL REUS W/TWL XL LVL3 (GOWN DISPOSABLE) ×1
KIT BASIN OR (CUSTOM PROCEDURE TRAY) ×2 IMPLANT
KIT REFILL CATH SYNCHROMED II (MISCELLANEOUS) IMPLANT
KIT TURNOVER KIT B (KITS) ×2 IMPLANT
NDL HYPO 18GX1.5 BLUNT FILL (NEEDLE) IMPLANT
NDL HYPO 25X1 1.5 SAFETY (NEEDLE) ×2 IMPLANT
NEEDLE HYPO 18GX1.5 BLUNT FILL (NEEDLE) IMPLANT
NEEDLE HYPO 25X1 1.5 SAFETY (NEEDLE) ×1 IMPLANT
NS IRRIG 1000ML POUR BTL (IV SOLUTION) ×2 IMPLANT
PACK LAMINECTOMY NEURO (CUSTOM PROCEDURE TRAY) ×2 IMPLANT
PUMP SYNCHROMED III 20ML RESVR (Neuro Prosthesis/Implant) IMPLANT
SPIKE FLUID TRANSFER (MISCELLANEOUS) ×2 IMPLANT
SPONGE SURGIFOAM ABS GEL SZ50 (HEMOSTASIS) IMPLANT
SPONGE T-LAP 4X18 ~~LOC~~+RFID (SPONGE) ×2 IMPLANT
STAPLER VISISTAT 35W (STAPLE) ×2 IMPLANT
SUT PROLENE 3 0 PS 2 (SUTURE) ×2 IMPLANT
SUT SILK 2 0 TIES 10X30 (SUTURE) ×2 IMPLANT
SUT VIC AB 0 CT1 18XCR BRD8 (SUTURE) ×4 IMPLANT
SUT VIC AB 0 CT1 8-18 (SUTURE) ×2
SUT VIC AB 2-0 CP2 18 (SUTURE) ×4 IMPLANT
SUT VIC AB 3-0 SH 8-18 (SUTURE) ×6 IMPLANT
SYR 20CC LL (SYRINGE) ×2 IMPLANT
SYR 3ML LL SCALE MARK (SYRINGE) ×2 IMPLANT
TAG SUTURE CLAMP YLW 5PR (MISCELLANEOUS) ×1
TOWEL GREEN STERILE (TOWEL DISPOSABLE) ×2 IMPLANT
TOWEL GREEN STERILE FF (TOWEL DISPOSABLE) ×2 IMPLANT
WATER STERILE IRR 1000ML POUR (IV SOLUTION) ×2 IMPLANT

## 2023-06-27 NOTE — Anesthesia Procedure Notes (Signed)
Procedure Name: Intubation Date/Time: 06/27/2023 1:18 PM  Performed by: Sharyn Dross, CRNAPre-anesthesia Checklist: Patient identified, Emergency Drugs available, Suction available and Patient being monitored Patient Re-evaluated:Patient Re-evaluated prior to induction Oxygen Delivery Method: Circle system utilized Preoxygenation: Pre-oxygenation with 100% oxygen Induction Type: IV induction Ventilation: Mask ventilation without difficulty Laryngoscope Size: Mac and 3 Grade View: Grade I Tube type: Oral Tube size: 7.0 mm Number of attempts: 1 Airway Equipment and Method: Stylet and Oral airway Placement Confirmation: ETT inserted through vocal cords under direct vision, positive ETCO2 and breath sounds checked- equal and bilateral Secured at: 22 cm Tube secured with: Tape Dental Injury: Teeth and Oropharynx as per pre-operative assessment

## 2023-06-27 NOTE — Op Note (Signed)
   Providing Compassionate, Quality Care - Together  Date of service: 06/27/2023  PREOP DIAGNOSIS:  Intrathecal pain pump, end of battery life  POSTOP DIAGNOSIS: Same  PROCEDURE: Replacement of intrathecal pain pump device, removal and replacement with Medtronic SynchroMed 2, volume 20 mL (morphine 1 mg/mL; next refill date per program on 08/31/2023)  SURGEON: Dr. Kendell Bane C. Meesha Sek, DO  ASSISTANT: None  ANESTHESIA: General Endotracheal  EBL: Less than 10 cc  SPECIMENS: None  DRAINS: None  COMPLICATIONS: None  CONDITION: Hemodynamically stable  HISTORY: Christy Davis is a 82 y.o. female with a history of an intrathecal pain pump, that was at end of device life.  I offered her replacement of the intrathecal pain pump, she has a history of placement of Medtronic, with morphine 1 mg/mL, 20 mL volume.  We discussed all risks, benefits and expected outcomes as well as alternatives to treatment.  Informed consent was obtained and witnessed.  We extensively went over risk of infection given her low body mass, she attempted to gain weight but could not be successful.  PROCEDURE IN DETAIL: The patient was brought to the operating room. After induction of general anesthesia, the patient was positioned on the operative table in the supine position. All pressure points were meticulously padded. Skin incision was then marked out and prepped and draped in the usual sterile fashion. Physician driven timeout was performed.  Local anesthetic was injected into the planned incision.  The prior incision was opened sharply with a 15 blade.  Using Bovie electrocautery, I then exposed the pain pump and its connection to the catheter which appeared intact.  This was gently extricated from the pocket.  Hemostasis of the pocket was achieved with monopolar cautery.  The pocket was copiously irrigated and noted to be excellently hemostatic.  We then aspirated the device and it had clear CSF flow without  difficulty.  Under guidance of the Medtronic representative, the new device was filled with morphine 1 mg/mL to the manufacturer's recommendation under sterile conditions.  This was then reconnected to the catheter, again aspirated and clear fluid was easily withdrawn without difficulty.  Using 3-0 Prolene sutures, retaining sutures were placed.  The device was then placed into the original pocket in its original position.  Vancomycin powder was placed.  The wound was then closed in layers, 2-0 Vicryl sutures for the pocket layer.  Dermis was closed with 2-0 and 3-0 Vicryl suture.  Skin was closed with skin glue.  Sterile dressing was applied.  At the end of the case all sponge, needle, and instrument counts were correct. The patient was then transferred to the stretcher, extubated, and taken to the post-anesthesia care unit in stable hemodynamic condition.

## 2023-06-27 NOTE — H&P (Addendum)
Providing Compassionate, Quality Care - Together  NEUROSURGERY HISTORY & PHYSICAL   Christy Davis is an 82 y.o. female.   Chief Complaint: Intrathecal pain pump, end of battery life HPI: This is an 82 year old female with a history of intrathecal pain pump that presents today for abdominal device change due to end of battery life.  She utilizes morphine, 1 mg/mL, current rate is at 0.27 mg per 24 hours.  Her pump is set to expire in October of this year therefore his knee replacement.  We discussed all risks, benefits and expected outcomes, she would like to proceed with replacement.  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)    Pneumonia    Polymyalgia rheumatica (HCC)    PONV (postoperative nausea and vomiting)    in past, not in past 2  years   Shortness of breath dyspnea    Sleep apnea     Past Surgical History:  Procedure Laterality Date   ATRIAL FIBRILLATION ABLATION  08/13/2022   BACK SURGERY     fusion L1 - S 2   BREAST SURGERY     COLONOSCOPY     EYE SURGERY Bilateral    cataract   JOINT REPLACEMENT     right knee and right hip   left hip replacement     MASTECTOMY Bilateral    1989 -left(?) and 2009-right(?)   PAIN PUMP REVISION N/A 11/15/2016   Procedure: Intrathecal pump replacement;  Surgeon: Odette Fraction, MD;  Location: [facility] OR;  Service: Neurosurgery;  Laterality: N/A;  Intrathecal pump replacement   pudendal nerve     right hip revision  10/2022   SPINAL CORD STIMULATOR INSERTION N/A  11/24/2015   Procedure: Spinal cord stimulator interregation of leads,  implantable pulse generator replacement;  Surgeon: Odette Fraction, MD;  Location: [facility];  Service: Neurosurgery;  Laterality: N/A;  Spinal cord stimulator interregation of leads,  implantable pulse generator replacement   TONSILLECTOMY     TOTAL SHOULDER REPLACEMENT Left 04/2016    Family History  Problem Relation Age of Onset   Stroke Father    Stroke Sister    Allergic rhinitis Neg Hx    Angioedema Neg Hx    Asthma Neg Hx    Atopy Neg Hx    Eczema Neg Hx    Immunodeficiency Neg Hx    Urticaria Neg Hx    Social History:  reports that she quit smoking about 47 years ago. Her smoking use included cigarettes. She started smoking about 62 years ago. She has a 7.5 pack-year smoking history. She has never used smokeless tobacco. She reports current alcohol use. She reports that she does not use drugs.  Allergies:  Allergies  Allergen Reactions   Buspirone     Confusion (intolerance)    Mirtazapine Nausea Only    Dizziness (intolerance)   Sucralfate Other (See Comments)    Burning Sensation  Tapentadol Other (See Comments)    Doesn't tolerate well   Gabapentin     Felt drunk   Lyrica [Pregabalin]     Felt drunk   Trazodone     Ineffective    Midodrine Itching   Sertraline     nontheraputic     Medications Prior to Admission  Medication Sig Dispense Refill   apixaban (ELIQUIS) 2.5 MG TABS tablet Take 2.5 mg by mouth 2 (two) times daily.     diltiazem (TIAZAC) 120 MG 24 hr capsule Take 120 mg by mouth at bedtime.     fludrocortisone (FLORINEF) 0.1 MG tablet Take 0.1 mg by mouth daily.     Fluticasone-Umeclidin-Vilant (TRELEGY ELLIPTA) 200-62.5-25 MCG/ACT AEPB Inhale 1 puff into the lungs daily.     levothyroxine (SYNTHROID, LEVOTHROID) 75 MCG tablet Take 75 mcg by mouth at bedtime.     metoprolol succinate (TOPROL-XL) 25 MG 24 hr tablet Take 25 mg by mouth at bedtime.     montelukast (SINGULAIR)  10 MG tablet Take 10 mg by mouth at bedtime.     Multiple Vitamins-Minerals (PRESERVISION AREDS PO) Take 1 capsule by mouth 2 (two) times daily.     NONFORMULARY OR COMPOUNDED ITEM Morphine pain pump     pantoprazole (PROTONIX) 40 MG tablet Take 40 mg by mouth 2 (two) times daily before a meal.     acetaminophen (TYLENOL) 500 MG tablet Take 1,000 mg by mouth every 6 (six) hours as needed for moderate pain or headache.     albuterol (PROVENTIL HFA;VENTOLIN HFA) 108 (90 Base) MCG/ACT inhaler Inhale 2 puffs into the lungs every 4 (four) hours as needed for wheezing or shortness of breath.      oxyCODONE (OXY IR/ROXICODONE) 5 MG immediate release tablet Take 5 mg by mouth every 6 (six) hours as needed for severe pain.      No results found for this or any previous visit (from the past 48 hour(s)). No results found.  ROS All pertinent positives and negatives are listed in HPI above  Blood pressure 129/72, pulse 73, temperature 98 F (36.7 C), temperature source Oral, resp. rate 18, height 5\' 3"  (1.6 m), weight 45.8 kg, SpO2 99%. Physical Exam  Awake alert oriented x 3, no acute distress Speech fluent and appropriate PERRLA Cranial nerves II through XII intact Moves all extremities equally, no focal deficits Quite thin appearing, right lower quadrant presence of intrathecal pain pump   Assessment/Plan 82 year old female with  Intrathecal pain pump, end of life  -She is due for a replacement by October of this year due to her pump expiring.  Therefore she presents today for replacement of the intrathecal pump.  We discussed all risks, benefits and expected outcomes.  I had extensively discussed with the patient beforehand about possibility of risk of infection or wound complications given her thin appearance and she attempted to gain weight without success.  She would like to still continue to proceed therefore we again extensively went over the risks.  Informed consent was obtained and  witnessed.   Thank you for allowing me to participate in this patient's care.  Please do not hesitate to call with questions or concerns.   Monia Pouch, DO Neurosurgeon [facility] [phone]

## 2023-06-27 NOTE — OR Nursing (Signed)
  6ml of Morphine Sulphate was withdrawn from pain pump device.   Wasted 6ml of Morphine Sulphate per protocol. Witnessed by: Lawson Radar, CRNA

## 2023-06-27 NOTE — Anesthesia Postprocedure Evaluation (Signed)
Anesthesia Post Note  Patient: Christy Davis  Procedure(s) Performed: INTRATHECAL PAIN PUMP DEVICE CHANGE     Patient location during evaluation: PACU Anesthesia Type: General Level of consciousness: awake and alert Pain management: pain level controlled Vital Signs Assessment: post-procedure vital signs reviewed and stable Respiratory status: spontaneous breathing, nonlabored ventilation, respiratory function stable and patient connected to nasal cannula oxygen Cardiovascular status: blood pressure returned to baseline and stable Postop Assessment: no apparent nausea or vomiting Anesthetic complications: no   There were no known notable events for this encounter.  Last Vitals:  Vitals:   06/27/23 1515 06/27/23 1530  BP: (!) 142/79 (!) 152/84  Pulse: 80 80  Resp: 10 16  Temp:  36.7 C  SpO2: 91% 94%    Last Pain:  Vitals:   06/27/23 1530  TempSrc:   PainSc: 0-No pain                 Hickman Nation

## 2023-06-27 NOTE — Transfer of Care (Signed)
Immediate Anesthesia Transfer of Care Note  Patient: Christy Davis  Procedure(s) Performed: INTRATHECAL PAIN PUMP DEVICE CHANGE  Patient Location: PACU  Anesthesia Type:General  Level of Consciousness: awake, alert , and oriented  Airway & Oxygen Therapy: Patient Spontanous Breathing  Post-op Assessment: Report given to RN, Post -op Vital signs reviewed and stable, and Patient moving all extremities X 4  Post vital signs: Reviewed and stable  Last Vitals:  Vitals Value Taken Time  BP 179/99 06/27/23 1444  Temp    Pulse 77 06/27/23 1444  Resp 9 06/27/23 1444  SpO2 100 % 06/27/23 1444  Vitals shown include unfiled device data.  Last Pain:  Vitals:   06/27/23 1055  TempSrc:   PainSc: 0-No pain      Patients Stated Pain Goal: 0 (06/27/23 1055)  Complications: There were no known notable events for this encounter.

## 2023-06-28 ENCOUNTER — Encounter (HOSPITAL_COMMUNITY): Payer: Self-pay | Admitting: Neurological Surgery

## 2024-06-28 DEATH — deceased
